# Patient Record
Sex: Female | Born: 1995 | Race: Black or African American | Hispanic: No | Marital: Single | Smoking: Never smoker
Health system: Southern US, Community
[De-identification: ages and names within clinical notes are randomized; demographics above are authoritative.]

## PROBLEM LIST (undated history)

## (undated) DIAGNOSIS — R51 Headache: Secondary | ICD-10-CM

## (undated) DIAGNOSIS — K219 Gastro-esophageal reflux disease without esophagitis: Secondary | ICD-10-CM

## (undated) DIAGNOSIS — R519 Headache, unspecified: Secondary | ICD-10-CM

## (undated) DIAGNOSIS — K2970 Gastritis, unspecified, without bleeding: Secondary | ICD-10-CM

## (undated) DIAGNOSIS — E559 Vitamin D deficiency, unspecified: Secondary | ICD-10-CM

## (undated) HISTORY — DX: Gastro-esophageal reflux disease without esophagitis: K21.9

## (undated) HISTORY — DX: Headache: R51

## (undated) HISTORY — DX: Headache, unspecified: R51.9

## (undated) HISTORY — DX: Gastritis, unspecified, without bleeding: K29.70

## (undated) HISTORY — PX: WISDOM TOOTH EXTRACTION: SHX21

## (undated) HISTORY — DX: Vitamin D deficiency, unspecified: E55.9

---
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
## 2015-12-07 DIAGNOSIS — H6523 Chronic serous otitis media, bilateral: Secondary | ICD-10-CM

## 2015-12-07 DIAGNOSIS — J309 Allergic rhinitis, unspecified: Secondary | ICD-10-CM | POA: Insufficient documentation

## 2015-12-08 ENCOUNTER — Ambulatory Visit (INDEPENDENT_AMBULATORY_CARE_PROVIDER_SITE_OTHER): Payer: Managed Care, Other (non HMO) | Admitting: Unknown Physician Specialty

## 2015-12-08 ENCOUNTER — Encounter: Payer: Self-pay | Admitting: Unknown Physician Specialty

## 2015-12-08 VITALS — BP 113/66 | HR 76 | Temp 97.5°F | Ht 63.3 in | Wt 138.6 lb

## 2015-12-08 DIAGNOSIS — R079 Chest pain, unspecified: Secondary | ICD-10-CM | POA: Diagnosis not present

## 2015-12-08 DIAGNOSIS — Z23 Encounter for immunization: Secondary | ICD-10-CM

## 2015-12-08 DIAGNOSIS — Z0000 Encounter for general adult medical examination without abnormal findings: Secondary | ICD-10-CM

## 2015-12-08 MED ORDER — OMEPRAZOLE 20 MG PO CPDR: 20.0000 mg | DELAYED_RELEASE_CAPSULE | Freq: Every day | ORAL | Status: DC

## 2015-12-08 NOTE — Progress Notes (Signed)
BP 113/66 mmHg  Pulse 76  Temp(Src) 97.5 F (36.4 C)  Ht 5' 3.3" (1.608 m)  Wt 138 lb 9.6 oz (62.869 kg)  BMI 24.31 kg/m2  SpO2 99%  LMP 11/26/2015 (Approximate)   Subjective:    Patient ID: Claudia Hanson, female    DOB: 06-20-1996, 19 y.o.   MRN: [id number]  HPI: Claudia Hanson is a 19 y.o. female  Chief Complaint  Patient presents with  . Chest Pain    pt states the pain comes and goes. States it is mainly on the right side but sometimes in the middle. [facility]   I reviewed the ER notes from 10/28/15 in which she is complaining of chest pain with dizzyness and lightheadedness.  The dizzy feeling are improved.  No obvious SOB but sometimes finds it hard to breath.  Chest pain starts when she lays down and sometimes when she gets up.  2 days ago her throat and chest hurt after eating. In the ER, had a D dimer, chest x-ray and EKG which were all normal.  No nausea or vomiting  Relevant past medical, surgical, family and social history reviewed and updated as indicated. Interim medical history since our last visit reviewed. Allergies and medications reviewed and updated.  Review of Systems  All other systems reviewed and are negative.   Per HPI unless specifically indicated above     Objective:    BP 113/66 mmHg  Pulse 76  Temp(Src) 97.5 F (36.4 C)  Ht 5' 3.3" (1.608 m)  Wt 138 lb 9.6 oz (62.869 kg)  BMI 24.31 kg/m2  SpO2 99%  LMP 11/26/2015 (Approximate)  Wt Readings from Last 3 Encounters:  12/08/15 138 lb 9.6 oz (62.869 kg)  05/12/15 136 lb (61.689 kg)    Physical Exam  Constitutional: She is oriented to person, place, and time. She appears well-developed and well-nourished. No distress.  HENT:  Head: Normocephalic and atraumatic.  Eyes: Conjunctivae and lids are normal. Right eye exhibits no discharge. Left eye exhibits no discharge. No scleral icterus.  Cardiovascular: Normal rate, regular rhythm and normal heart  sounds.  Exam reveals no gallop and no friction rub.   No murmur heard. Pulmonary/Chest: Effort normal and breath sounds normal. No respiratory distress.  Abdominal: Soft. Normal appearance and bowel sounds are normal. She exhibits no distension. There is no splenomegaly or hepatomegaly. There is no tenderness.  Musculoskeletal: Normal range of motion.  Neurological: She is alert and oriented to person, place, and time.  Skin: Skin is intact. No rash noted. No pallor.  Psychiatric: She has a normal mood and affect. Her behavior is normal. Judgment and thought content normal.    No results found for this or any previous visit.    Assessment & Plan:   Problem List Items Addressed This Visit    None    Visit Diagnoses    Immunization due    -  Primary    Relevant Orders    Flu Vaccine QUAD 36+ mos IM (Completed)    Chest pain, unspecified chest pain type        Pt with non-specific chest pain.  Chest x-ray and EKG were negative in the ER.  I will rx with Omeprazole 20 mg and order a cardiac echo for further evaluation    Relevant Orders    ECHO LIMITED        Follow up plan: Return in about 3 weeks (around 12/29/2015).

## 2015-12-08 NOTE — Addendum Note (Signed)
Addended by: Gabriel CirriWICKER, Jionni Helming on: 12/08/2015 10:48 AM   Modules accepted: Orders

## 2015-12-10 ENCOUNTER — Telehealth: Payer: Self-pay | Admitting: Unknown Physician Specialty

## 2015-12-10 DIAGNOSIS — R079 Chest pain, unspecified: Secondary | ICD-10-CM

## 2015-12-10 NOTE — Telephone Encounter (Signed)
An order was put in for a echo limited and [facility] Would like to clarify because they generally do a echo complete

## 2015-12-10 NOTE — Telephone Encounter (Signed)
[facility] and let [facility] know. Found out that the limited is generally done if a previous echo was done within the month. They would greatly appreciate it if you could put in a new order.

## 2015-12-10 NOTE — Telephone Encounter (Signed)
Echo complete is fine.  I just haven't ordered one and was not clear what simple vs complete entails

## 2015-12-11 LAB — QUANTIFERON IN TUBE
QFT TB AG MINUS NIL VALUE: <0 IU/mL
QUANTIFERON MITOGEN VALUE: 8.81 IU/mL
QUANTIFERON NIL VALUE: 0.02 [IU]/mL
QUANTIFERON TB AG VALUE: 0 [IU]/mL
QUANTIFERON TB GOLD: NEGATIVE

## 2015-12-11 LAB — QUANTIFERON TB GOLD ASSAY (BLOOD)

## 2015-12-22 ENCOUNTER — Ambulatory Visit
Admission: RE | Admit: 2015-12-22 | Discharge: 2015-12-22 | Disposition: A | Discharge status: Home / Self Care | Payer: Managed Care, Other (non HMO) | Source: Ambulatory Visit | Attending: Unknown Physician Specialty | Admitting: Unknown Physician Specialty

## 2015-12-22 DIAGNOSIS — R079 Chest pain, unspecified: Secondary | ICD-10-CM | POA: Diagnosis not present

## 2015-12-22 NOTE — Progress Notes (Signed)
*  PRELIMINARY RESULTS* Echocardiogram 2D Echocardiogram has been performed.  Claudia HousekeeperJerry R Hege 12/22/2015, 10:38 AM

## 2015-12-29 ENCOUNTER — Ambulatory Visit (INDEPENDENT_AMBULATORY_CARE_PROVIDER_SITE_OTHER): Payer: Managed Care, Other (non HMO) | Admitting: Unknown Physician Specialty

## 2015-12-29 ENCOUNTER — Encounter: Payer: Self-pay | Admitting: Unknown Physician Specialty

## 2015-12-29 VITALS — BP 135/76 | HR 73 | Temp 98.3°F | Ht 62.2 in | Wt 138.6 lb

## 2015-12-29 DIAGNOSIS — J4530 Mild persistent asthma, uncomplicated: Secondary | ICD-10-CM | POA: Diagnosis not present

## 2015-12-29 DIAGNOSIS — R079 Chest pain, unspecified: Secondary | ICD-10-CM | POA: Diagnosis not present

## 2015-12-29 DIAGNOSIS — J45909 Unspecified asthma, uncomplicated: Secondary | ICD-10-CM | POA: Insufficient documentation

## 2015-12-29 NOTE — Assessment & Plan Note (Signed)
Start Singulair and Albuterol prn for chest pain

## 2015-12-29 NOTE — Progress Notes (Signed)
BP 135/76 mmHg  Pulse 73  Temp(Src) 98.3 F (36.8 C)  Ht 5' 2.2" (1.58 m)  Wt 138 lb 9.6 oz (62.869 kg)  BMI 25.18 kg/m2  SpO2 99%  LMP 11/26/2015 (Approximate)   Subjective:    Patient ID: Claudia Hanson, female    DOB: 11-21-1996, 19 y.o.   MRN: [id number]  HPI: Claudia Hanson is a 20 y.o. female  Chief Complaint  Patient presents with  . Chest Pain    pt states she is following up on her chest pain that she states is better than it was when she was here last time   Reviewed Echocardiogram which was negative.  She had a chest x-ray in the ER when she was seen for the same which was negative.  Pt is better but this seems to be a problem that comes and goes.  Chest pain gets worse in the cold.  No wheezing or SOB.  No history of asthma.  Pain changes often with position changes.    Relevant past medical, surgical, family and social history reviewed and updated as indicated. Interim medical history since our last visit reviewed. Allergies and medications reviewed and updated.  Review of Systems  Per HPI unless specifically indicated above     Objective:    BP 135/76 mmHg  Pulse 73  Temp(Src) 98.3 F (36.8 C)  Ht 5' 2.2" (1.58 m)  Wt 138 lb 9.6 oz (62.869 kg)  BMI 25.18 kg/m2  SpO2 99%  LMP 11/26/2015 (Approximate)  Wt Readings from Last 3 Encounters:  12/29/15 138 lb 9.6 oz (62.869 kg)  12/08/15 138 lb 9.6 oz (62.869 kg)  05/12/15 136 lb (61.689 kg)    Physical Exam  Constitutional: She is oriented to person, place, and time. She appears well-developed and well-nourished. No distress.  HENT:  Head: Normocephalic and atraumatic.  Eyes: Conjunctivae and lids are normal. Right eye exhibits no discharge. Left eye exhibits no discharge. No scleral icterus.  Cardiovascular: Normal rate.   Pulmonary/Chest: Effort normal.  Abdominal: Normal appearance. There is no splenomegaly or hepatomegaly.  Musculoskeletal: Normal range of motion.  Neurological: She is alert and  oriented to person, place, and time.  Skin: Skin is intact. No rash noted. No pallor.  Psychiatric: She has a normal mood and affect. Her behavior is normal. Judgment and thought content normal.   Spirometry does show improvement pre and post.   Results for orders placed or performed in visit on 12/08/15  Quantiferon tb gold assay  Result Value Ref Range   QUANTIFERON INCUBATION Comment   QuantiFERON In Tube  Result Value Ref Range   QUANTIFERON TB GOLD Negative Negative   QUANTIFERON CRITERIA Comment    QUANTIFERON TB AG VALUE 0.00 IU/mL   Quantiferon Nil Value 0.02 IU/mL   QUANTIFERON MITOGEN VALUE 8.81 IU/mL   QFT TB AG MINUS NIL VALUE <0.00 IU/mL   Interpretation: Comment       Assessment & Plan:   Problem List Items Addressed This Visit      Unprioritized   Chest pain syndrome - Primary    Chest pain that is not reproducible.  Seems to be intermittent and it comes and goes and maybe associated with asthma.  Will treat asthma and see if chest pain improves      Relevant Orders   Spirometry with Graph (Completed)   Asthma    Start Singulair and Albuterol prn for chest pain          Follow up  plan: Return in about 6 weeks (around 02/09/2016).

## 2015-12-29 NOTE — Assessment & Plan Note (Addendum)
Chest pain that is not reproducible.  Seems to be intermittent and it comes and goes and maybe associated with asthma.  Will treat asthma and see if chest pain improves

## 2015-12-31 ENCOUNTER — Telehealth: Payer: Self-pay | Admitting: Unknown Physician Specialty

## 2015-12-31 NOTE — Telephone Encounter (Signed)
Pt called stated the pharmacy never received her RX from her last visit. Pt asks if the RX's can be sent again or called in. Pharm is [facility] on [address] Thanks.

## 2015-12-31 NOTE — Telephone Encounter (Signed)
Routing to provider. I called the patient to see which medication she was talking about and she stated that Elnita MaxwellCheryl was going to send in a pump and some kind of prescription for asthma.

## 2016-01-01 MED ORDER — ALBUTEROL SULFATE HFA 108 (90 BASE) MCG/ACT IN AERS: 2.0000 | INHALATION_SPRAY | Freq: Four times a day (QID) | RESPIRATORY_TRACT | Status: DC | PRN

## 2016-01-01 MED ORDER — MONTELUKAST SODIUM 10 MG PO TABS: 10.0000 mg | ORAL_TABLET | Freq: Every day | ORAL | Status: DC

## 2016-01-01 NOTE — Telephone Encounter (Signed)
Called and left patient a voicemail letting her know that her prescriptions were sent to her pharmacy.

## 2016-01-01 NOTE — Telephone Encounter (Signed)
Sent   Please let pt know

## 2016-02-12 ENCOUNTER — Ambulatory Visit: Payer: Managed Care, Other (non HMO) | Admitting: Unknown Physician Specialty

## 2016-02-12 ENCOUNTER — Ambulatory Visit (INDEPENDENT_AMBULATORY_CARE_PROVIDER_SITE_OTHER): Payer: Managed Care, Other (non HMO) | Admitting: Unknown Physician Specialty

## 2016-02-12 ENCOUNTER — Encounter: Payer: Self-pay | Admitting: Unknown Physician Specialty

## 2016-02-12 VITALS — BP 138/70 | HR 103 | Temp 98.8°F | Ht 61.8 in | Wt 136.6 lb

## 2016-02-12 DIAGNOSIS — H9201 Otalgia, right ear: Secondary | ICD-10-CM | POA: Diagnosis not present

## 2016-02-12 DIAGNOSIS — R0602 Shortness of breath: Secondary | ICD-10-CM | POA: Diagnosis not present

## 2016-02-12 DIAGNOSIS — R079 Chest pain, unspecified: Secondary | ICD-10-CM | POA: Diagnosis not present

## 2016-02-12 DIAGNOSIS — J45909 Unspecified asthma, uncomplicated: Secondary | ICD-10-CM | POA: Diagnosis not present

## 2016-02-12 NOTE — Progress Notes (Signed)
BP 138/70 mmHg  Pulse 103  Temp(Src) 98.8 F (37.1 C)  Ht 5' 1.8" (1.57 m)  Wt 136 lb 9.6 oz (61.961 kg)  BMI 25.14 kg/m2  SpO2 99%  LMP 11/26/2015 (Exact Date)   Subjective:    Patient ID: Claudia Hanson, female    DOB: [birth year], 19 y.o.   MRN: [id number]  HPI: Claudia Hanson is a 20 y.o. female  Chief Complaint  Patient presents with  . Asthma  . Headache    pt states she was seen at urgent care and diagnosed with sinusitis. states she also has a lump under chin that she was told to have looked at if it did not go away.   . Chest Pain    pt states she has been having right lower rib pain after having a fall   Chest pain Here to f/u from chest pain.  Pt states that she was doing well until recently at work when she had a sudden onset of chest pain with right arm pain.  Episode lasted about 15 minutes but was worried she was going to have a heart attack.  Following she felt light-headed and dizzy.    Asthma No improvement with Singulair and Albuterol.  She states it is "just not how it used to be."  Last Spirometry was normal  Depression screen [facility] 2/9 02/12/2016  Decreased Interest 0  Down, Depressed, Hopeless 1  PHQ - 2 Score 1  Altered sleeping 0  Tired, decreased energy 0  Change in appetite 2  Feeling bad or failure about yourself  1  Trouble concentrating 2  Moving slowly or fidgety/restless 0  Suicidal thoughts 0  PHQ-9 Score 6     Relevant past medical, surgical, family and social history reviewed and updated as indicated. Interim medical history since our last visit reviewed. Allergies and medications reviewed and updated.  Review of Systems  Constitutional: Negative.   HENT: Positive for ear pain.   Musculoskeletal:       Chest wall pain  Psychiatric/Behavioral: Negative.     Per HPI unless specifically indicated above     Objective:    BP 138/70 mmHg  Pulse 103  Temp(Src) 98.8 F (37.1 C)  Ht 5' 1.8" (1.57 m)  Wt 136 lb 9.6 oz (61.961 kg)   BMI 25.14 kg/m2  SpO2 99%  LMP 11/26/2015 (Exact Date)  Wt Readings from Last 3 Encounters:  02/12/16 136 lb 9.6 oz (61.961 kg)  12/29/15 138 lb 9.6 oz (62.869 kg)  12/08/15 138 lb 9.6 oz (62.869 kg)    Physical Exam  Constitutional: She is oriented to person, place, and time. She appears well-developed and well-nourished. No distress.  HENT:  Head: Normocephalic and atraumatic.  Eyes: Conjunctivae and lids are normal. Right eye exhibits no discharge. Left eye exhibits no discharge. No scleral icterus.  Neck: Normal range of motion. Neck supple. No JVD present. Carotid bruit is not present. No tracheal deviation present. No thyromegaly present.  No lymphadenopathy noted  Cardiovascular: Normal rate, regular rhythm and normal heart sounds.   Pulmonary/Chest: Effort normal and breath sounds normal.  Abdominal: Normal appearance. There is no splenomegaly or hepatomegaly.  Musculoskeletal: Normal range of motion.  Neurological: She is alert and oriented to person, place, and time.  Skin: Skin is warm, dry and intact. No rash noted. No pallor.  Psychiatric: She has a normal mood and affect. Her behavior is normal. Judgment and thought content normal.       Assessment &  Plan:   Problem List Items Addressed This Visit      Unprioritized   Chest pain syndrome    Suspect pain and SOB related to anxiety.  Discussed counseling through her school at [employer]      RESOLVED: Asthma - Primary    Stop singulair for no effict       Other Visit Diagnoses    Ear pain, right        Normal.  suspect related to TMJ    SOB (shortness of breath)        Suspect related to anxiety         Follow up plan: Return in about 3 months (around 05/11/2016).

## 2016-02-12 NOTE — Assessment & Plan Note (Signed)
Stop singulair for no effict

## 2016-02-12 NOTE — Assessment & Plan Note (Signed)
Suspect pain and SOB related to anxiety.  Discussed counseling through her school at [employer] [facility]

## 2016-02-19 ENCOUNTER — Ambulatory Visit (INDEPENDENT_AMBULATORY_CARE_PROVIDER_SITE_OTHER): Payer: Managed Care, Other (non HMO) | Admitting: Family Medicine

## 2016-02-19 ENCOUNTER — Encounter: Payer: Self-pay | Admitting: Family Medicine

## 2016-02-19 VITALS — BP 125/84 | HR 92 | Temp 99.3°F | Ht 62.7 in | Wt 137.0 lb

## 2016-02-19 DIAGNOSIS — R599 Enlarged lymph nodes, unspecified: Secondary | ICD-10-CM | POA: Diagnosis not present

## 2016-02-19 DIAGNOSIS — R030 Elevated blood-pressure reading, without diagnosis of hypertension: Secondary | ICD-10-CM

## 2016-02-19 DIAGNOSIS — IMO0001 Reserved for inherently not codable concepts without codable children: Secondary | ICD-10-CM

## 2016-02-19 DIAGNOSIS — R591 Generalized enlarged lymph nodes: Secondary | ICD-10-CM

## 2016-02-19 NOTE — Patient Instructions (Signed)
DASH Eating Plan  DASH stands for "Dietary Approaches to Stop Hypertension." The DASH eating plan is a healthy eating plan that has been shown to reduce high blood pressure (hypertension). Additional health benefits may include reducing the risk of type 2 diabetes mellitus, heart disease, and stroke. The DASH eating plan may also help with weight loss.  WHAT DO I NEED TO KNOW ABOUT THE DASH EATING PLAN?  For the DASH eating plan, you will follow these general guidelines:  · Choose foods with a percent daily value for sodium of less than 5% (as listed on the food label).  · Use salt-free seasonings or herbs instead of table salt or sea salt.  · Check with your health care provider or pharmacist before using salt substitutes.  · Eat lower-sodium products, often labeled as "lower sodium" or "no salt added."  · Eat fresh foods.  · Eat more vegetables, fruits, and low-fat dairy products.  · Choose whole grains. Look for the word "whole" as the first word in the ingredient list.  · Choose fish and skinless chicken or turkey more often than red meat. Limit fish, poultry, and meat to 6 oz (170 g) each day.  · Limit sweets, desserts, sugars, and sugary drinks.  · Choose heart-healthy fats.  · Limit cheese to 1 oz (28 g) per day.  · Eat more home-cooked food and less restaurant, buffet, and fast food.  · Limit fried foods.  · Cook foods using methods other than frying.  · Limit canned vegetables. If you do use them, rinse them well to decrease the sodium.  · When eating at a restaurant, ask that your food be prepared with less salt, or no salt if possible.  WHAT FOODS CAN I EAT?  Seek help from a dietitian for individual calorie needs.  Grains  Whole grain or whole wheat bread. Brown rice. Whole grain or whole wheat pasta. Quinoa, bulgur, and whole grain cereals. Low-sodium cereals. Corn or whole wheat flour tortillas. Whole grain cornbread. Whole grain crackers. Low-sodium crackers.  Vegetables  Fresh or frozen vegetables  (raw, steamed, roasted, or grilled). Low-sodium or reduced-sodium tomato and vegetable juices. Low-sodium or reduced-sodium tomato sauce and paste. Low-sodium or reduced-sodium canned vegetables.   Fruits  All fresh, canned (in natural juice), or frozen fruits.  Meat and Other Protein Products  Ground beef (85% or leaner), grass-fed beef, or beef trimmed of fat. Skinless chicken or turkey. Ground chicken or turkey. Pork trimmed of fat. All fish and seafood. Eggs. Dried beans, peas, or lentils. Unsalted nuts and seeds. Unsalted canned beans.  Dairy  Low-fat dairy products, such as skim or 1% milk, 2% or reduced-fat cheeses, low-fat ricotta or cottage cheese, or plain low-fat yogurt. Low-sodium or reduced-sodium cheeses.  Fats and Oils  Tub margarines without trans fats. Light or reduced-fat mayonnaise and salad dressings (reduced sodium). Avocado. Safflower, olive, or canola oils. Natural peanut or almond butter.  Other  Unsalted popcorn and pretzels.  The items listed above may not be a complete list of recommended foods or beverages. Contact your dietitian for more options.  WHAT FOODS ARE NOT RECOMMENDED?  Grains  White bread. White pasta. White rice. Refined cornbread. Bagels and croissants. Crackers that contain trans fat.  Vegetables  Creamed or fried vegetables. Vegetables in a cheese sauce. Regular canned vegetables. Regular canned tomato sauce and paste. Regular tomato and vegetable juices.  Fruits  Dried fruits. Canned fruit in light or heavy syrup. Fruit juice.  Meat and Other Protein   Products  Fatty cuts of meat. Ribs, chicken wings, bacon, sausage, bologna, salami, chitterlings, fatback, hot dogs, bratwurst, and packaged luncheon meats. Salted nuts and seeds. Canned beans with salt.  Dairy  Whole or 2% milk, cream, half-and-half, and cream cheese. Whole-fat or sweetened yogurt. Full-fat cheeses or blue cheese. Nondairy creamers and whipped toppings. Processed cheese, cheese spreads, or cheese  curds.  Condiments  Onion and garlic salt, seasoned salt, table salt, and sea salt. Canned and packaged gravies. Worcestershire sauce. Tartar sauce. Barbecue sauce. Teriyaki sauce. Soy sauce, including reduced sodium. Steak sauce. Fish sauce. Oyster sauce. Cocktail sauce. Horseradish. Ketchup and mustard. Meat flavorings and tenderizers. Bouillon cubes. Hot sauce. Tabasco sauce. Marinades. Taco seasonings. Relishes.  Fats and Oils  Butter, stick margarine, lard, shortening, ghee, and bacon fat. Coconut, palm kernel, or palm oils. Regular salad dressings.  Other  Pickles and olives. Salted popcorn and pretzels.  The items listed above may not be a complete list of foods and beverages to avoid. Contact your dietitian for more information.  WHERE CAN I FIND MORE INFORMATION?  National Heart, Lung, and Blood Institute: www.nhlbi.nih.gov/health/health-topics/topics/dash/     This information is not intended to replace advice given to you by your health care provider. Make sure you discuss any questions you have with your health care provider.     Document Released: 12/01/2011 Document Revised: 01/02/2015 Document Reviewed: 10/16/2013  Elsevier Interactive Patient Education ©2016 Elsevier Inc.

## 2016-02-19 NOTE — Progress Notes (Signed)
BP 125/84 mmHg  Pulse 92  Temp(Src) 99.3 F (37.4 C)  Ht 5' 2.7" (1.593 m)  Wt 137 lb (62.143 kg)  BMI 24.49 kg/m2  SpO2 98%  LMP 12/05/2015   Subjective:    Patient ID: Claudia Hanson, female    DOB: 1996/06/21, 20 y.o.   MRN: [id number]  HPI: Claudia Hanson is a 20 y.o. female  Chief Complaint  Patient presents with  . Hypertension  . "bump" behind left ear   ER FOLLOW UP Time since discharge: 4 days ago Hospital/facility: [facility] Diagnosis: UTI Procedures/tests: UA, chest x-ray, EKG  New medications: abx Discharge instructions: follow up here   Status: better  ELEVATED BLOOD PRESSURE- went to urgent care and her BP was elevated up to 150s, had UTI with very high fever Duration of elevated BP: 1 week BP monitoring frequency: not checking Previous BP meds: none Recent stressors: yes Family history of hypertension: no Recurrent headaches: yes, daily, have resolved Visual changes: no Palpitations: yes  Dyspnea: no Chest pain: yes- chronic has been going on for a while Lower extremity edema: no Dizzy/lightheaded: yes Transient ischemic attacks:no  Has a bump behind her L ear.   Relevant past medical, surgical, family and social history reviewed and updated as indicated. Interim medical history since our last visit reviewed. Allergies and medications reviewed and updated.  Review of Systems  Constitutional: Negative.   Respiratory: Negative.   Cardiovascular: Negative.   Psychiatric/Behavioral: Negative.     Per HPI unless specifically indicated above     Objective:    BP 125/84 mmHg  Pulse 92  Temp(Src) 99.3 F (37.4 C)  Ht 5' 2.7" (1.593 m)  Wt 137 lb (62.143 kg)  BMI 24.49 kg/m2  SpO2 98%  LMP 12/05/2015  Wt Readings from Last 3 Encounters:  02/19/16 137 lb (62.143 kg)  02/12/16 136 lb 9.6 oz (61.961 kg)  12/29/15 138 lb 9.6 oz (62.869 kg)    Physical Exam  Constitutional: She is oriented to person, place, and time. She appears well-developed  and well-nourished. No distress.  HENT:  Head: Normocephalic and atraumatic.  Right Ear: Hearing normal.  Left Ear: Hearing normal.  Nose: Nose normal.  Posterior auricular lymph node slightly enlarged on the L  Eyes: Conjunctivae and lids are normal. Right eye exhibits no discharge. Left eye exhibits no discharge. No scleral icterus.  Cardiovascular: Normal rate, regular rhythm, normal heart sounds and intact distal pulses.  Exam reveals no gallop and no friction rub.   No murmur heard. Pulmonary/Chest: Effort normal and breath sounds normal. No respiratory distress. She has no wheezes. She has no rales. She exhibits no tenderness.  Musculoskeletal: Normal range of motion.  Neurological: She is alert and oriented to person, place, and time.  Skin: Skin is warm, dry and intact. No rash noted. No erythema. No pallor.  Psychiatric: She has a normal mood and affect. Her speech is normal and behavior is normal. Judgment and thought content normal. Cognition and memory are normal.  Nursing note and vitals reviewed.   Results for orders placed or performed in visit on 12/08/15  Quantiferon tb gold assay  Result Value Ref Range   QUANTIFERON INCUBATION Comment   QuantiFERON In Tube  Result Value Ref Range   QUANTIFERON TB GOLD Negative Negative   QUANTIFERON CRITERIA Comment    QUANTIFERON TB AG VALUE 0.00 IU/mL   Quantiferon Nil Value 0.02 IU/mL   QUANTIFERON MITOGEN VALUE 8.81 IU/mL   QFT TB AG MINUS NIL VALUE <0.00  IU/mL   Interpretation: Comment       Assessment & Plan:   Problem List Items Addressed This Visit    None    Visit Diagnoses    Elevated BP    -  Primary    BP good today. Likely due to her being sick. of exercise/week, DASH diet given. Recheck at follow up.     Lymphadenopathy of head and neck        Stop rubbing it. Monitor. If not better by next visit, check CBC.         Follow up plan: Return As scheduled.

## 2016-03-21 ENCOUNTER — Telehealth: Payer: Self-pay

## 2016-03-21 NOTE — Telephone Encounter (Signed)
Patient called and left me a voicemail saying that she was returning a call from us. I called and spoke to patient. She stated that she was on my-chart last night and requested an appointment. She stated that someone called her this morning but they did not leave a message. Patient stated she wanted to schedule an appointment for leg pain so I scheduled patient an appointment for 03/25/16 at 11am.

## 2016-03-25 ENCOUNTER — Encounter: Payer: Self-pay | Admitting: Unknown Physician Specialty

## 2016-03-25 ENCOUNTER — Ambulatory Visit (INDEPENDENT_AMBULATORY_CARE_PROVIDER_SITE_OTHER): Payer: Managed Care, Other (non HMO) | Admitting: Unknown Physician Specialty

## 2016-03-25 VITALS — BP 149/90 | HR 106 | Temp 98.7°F | Ht 62.0 in | Wt 137.2 lb

## 2016-03-25 DIAGNOSIS — M791 Myalgia, unspecified site: Secondary | ICD-10-CM

## 2016-03-25 NOTE — Progress Notes (Signed)
BP 149/90 mmHg  Pulse 106  Temp(Src) 98.7 F (37.1 C)  Ht '5\' 2"'  (1.575 m)  Wt 137 lb 3.2 oz (62.234 kg)  BMI 25.09 kg/m2  SpO2 98%  LMP 03/12/2016 (Approximate)   Subjective:    Patient ID: Claudia Hanson, female    DOB: [birth year], 20 y.o.   MRN: [id number]  HPI: Claudia Hanson is a 20 y.o. female  Chief Complaint  Patient presents with  . Leg Pain    pt states she has been having pain in both legs for about 2 weeks, states the pain is mainly behind her knees and back of thighs   Pt states pain has been going on for about 2 weeks.  She states her arms hurt, mostly in muscles but possibly in elbows.  Note is made about frequent visits for non-specific chest pain.  No fatigue but sometimes dizzy or light-headed and gets ringing in ears.  Numbness in feet that comes and goes  Relevant past medical, surgical, family and social history reviewed and updated as indicated. Interim medical history since our last visit reviewed. Allergies and medications reviewed and updated.  Review of Systems  Per HPI unless specifically indicated above     Objective:    BP 149/90 mmHg  Pulse 106  Temp(Src) 98.7 F (37.1 C)  Ht '5\' 2"'  (1.575 m)  Wt 137 lb 3.2 oz (62.234 kg)  BMI 25.09 kg/m2  SpO2 98%  LMP 03/12/2016 (Approximate)  Wt Readings from Last 3 Encounters:  03/25/16 137 lb 3.2 oz (62.234 kg)  02/19/16 137 lb (62.143 kg)  02/12/16 136 lb 9.6 oz (61.961 kg)    Physical Exam  Constitutional: She is oriented to person, place, and time. She appears well-developed and well-nourished. No distress.  HENT:  Head: Normocephalic and atraumatic.  Eyes: Conjunctivae and lids are normal. Right eye exhibits no discharge. Left eye exhibits no discharge. No scleral icterus.  Neck: Normal range of motion. Neck supple. No JVD present. Carotid bruit is not present.  Cardiovascular: Normal rate, regular rhythm and normal heart sounds.   Pulmonary/Chest: Effort normal and breath sounds normal.   Abdominal: Normal appearance. There is no splenomegaly or hepatomegaly.  Musculoskeletal: Normal range of motion. She exhibits no edema or tenderness.  Neurological: She is alert and oriented to person, place, and time. She has normal reflexes.  Skin: Skin is warm, dry and intact. No rash noted. No pallor.  Psychiatric: She has a normal mood and affect. Her behavior is normal. Judgment and thought content normal.    Results for orders placed or performed in visit on 12/08/15  Quantiferon tb gold assay  Result Value Ref Range   QUANTIFERON INCUBATION Comment   QuantiFERON In Tube  Result Value Ref Range   QUANTIFERON TB GOLD Negative Negative   QUANTIFERON CRITERIA Comment    QUANTIFERON TB AG VALUE 0.00 IU/mL   Quantiferon Nil Value 0.02 IU/mL   QUANTIFERON MITOGEN VALUE 8.81 IU/mL   QFT TB AG MINUS NIL VALUE <0.00 IU/mL   Interpretation: Comment       Assessment & Plan:   Problem List Items Addressed This Visit    None    Visit Diagnoses    Muscle pain    -  Primary    Pt with persistant pain in joints and muscles at multiple sites.  Will check labs.   Refer to rheumatology for further evaluation    Relevant Orders    ANA w/Reflex    Ferritin  CBC with Differential/Platelet    Sed Rate (ESR)    C-reactive protein    Rheumatoid Arthritis Profile    Ambulatory referral to Rheumatology    Comprehensive metabolic panel    VITAMIN D 25 Hydroxy (Vit-D Deficiency, Fractures)    Vitamin B12         Follow up plan: Return for resutls and f/u with rheumatologist for negative labs or positive rheumatology labs.

## 2016-03-27 LAB — CBC WITH DIFFERENTIAL/PLATELET
Basophils Absolute: 0 10*3/uL (ref 0.0–0.2)
Basos: 0 %
EOS (ABSOLUTE): 0.3 10*3/uL (ref 0.0–0.4)
EOS: 2 %
HEMATOCRIT: 39.4 % (ref 34.0–46.6)
Hemoglobin: 13 g/dL (ref 11.1–15.9)
Immature Grans (Abs): 0 10*3/uL (ref 0.0–0.1)
Immature Granulocytes: 0 %
LYMPHS ABS: 1.8 10*3/uL (ref 0.7–3.1)
Lymphs: 16 %
MCH: 29.8 pg (ref 26.6–33.0)
MCHC: 33 g/dL (ref 31.5–35.7)
MCV: 90 fL (ref 79–97)
MONOS ABS: 0.5 10*3/uL (ref 0.1–0.9)
Monocytes: 5 %
Neutrophils Absolute: 8.9 10*3/uL — ABNORMAL HIGH (ref 1.4–7.0)
Neutrophils: 77 %
Platelets: 292 10*3/uL (ref 150–379)
RBC: 4.36 x10E6/uL (ref 3.77–5.28)
RDW: 12.6 % (ref 12.3–15.4)
WBC: 11.5 10*3/uL — AB (ref 3.4–10.8)

## 2016-03-27 LAB — COMPREHENSIVE METABOLIC PANEL
ALBUMIN: 4.5 g/dL (ref 3.5–5.5)
ALK PHOS: 47 IU/L (ref 39–117)
ALT: 12 IU/L (ref 0–32)
AST: 16 IU/L (ref 0–40)
Albumin/Globulin Ratio: 1.7 (ref 1.2–2.2)
BILIRUBIN TOTAL: 0.2 mg/dL (ref 0.0–1.2)
BUN / CREAT RATIO: 9 (ref 8–20)
BUN: 6 mg/dL (ref 6–20)
CHLORIDE: 107 mmol/L — AB (ref 96–106)
CO2: 22 mmol/L (ref 18–29)
CREATININE: 0.67 mg/dL (ref 0.57–1.00)
Calcium: 9.6 mg/dL (ref 8.7–10.2)
GFR calc Af Amer: 145 mL/min/{1.73_m2} (ref >59)
GFR calc non Af Amer: 126 mL/min/{1.73_m2} (ref >59)
GLOBULIN, TOTAL: 2.7 g/dL (ref 1.5–4.5)
GLUCOSE: 67 mg/dL (ref 65–99)
Potassium: 4.9 mmol/L (ref 3.5–5.2)
SODIUM: 147 mmol/L — AB (ref 134–144)
Total Protein: 7.2 g/dL (ref 6.0–8.5)

## 2016-03-27 LAB — VITAMIN D 25 HYDROXY (VIT D DEFICIENCY, FRACTURES): VIT D 25 HYDROXY: 10.5 ng/mL — AB (ref 30.0–100.0)

## 2016-03-27 LAB — ANA W/REFLEX: Anti Nuclear Antibody(ANA): NEGATIVE

## 2016-03-27 LAB — SEDIMENTATION RATE: Sed Rate: 4 mm/hr (ref 0–32)

## 2016-03-27 LAB — VITAMIN B12: VITAMIN B 12: 261 pg/mL (ref 211–946)

## 2016-03-27 LAB — RHEUMATOID ARTHRITIS PROFILE
Cyclic Citrullin Peptide Ab: 4 units (ref 0–19)
Rhuematoid fact SerPl-aCnc: <10 IU/mL (ref 0.0–13.9)

## 2016-03-27 LAB — C-REACTIVE PROTEIN: CRP: 50.2 mg/L — AB (ref 0.0–4.9)

## 2016-03-27 LAB — FERRITIN: FERRITIN: 50 ng/mL (ref 15–150)

## 2016-03-28 ENCOUNTER — Other Ambulatory Visit: Payer: Self-pay | Admitting: Unknown Physician Specialty

## 2016-03-28 ENCOUNTER — Telehealth: Payer: Self-pay

## 2016-03-28 DIAGNOSIS — R7982 Elevated C-reactive protein (CRP): Secondary | ICD-10-CM

## 2016-03-28 MED ORDER — VITAMIN D (ERGOCALCIFEROL) 1.25 MG (50000 UNIT) PO CAPS: 50000.0000 [IU] | ORAL_CAPSULE | ORAL | Status: DC

## 2016-03-28 NOTE — Telephone Encounter (Signed)
Patient called and left me a voicemail stating that she would like to speak to [place] about some tests she had done and is concerned about. She requests a call back from [place] at [phone].

## 2016-03-28 NOTE — Telephone Encounter (Signed)
Phone call with pt.  Discussed referral to rheumatology due to non-specific elevation in CRP and intermittent pain.

## 2016-04-01 DIAGNOSIS — M791 Myalgia, unspecified site: Secondary | ICD-10-CM | POA: Insufficient documentation

## 2016-04-01 DIAGNOSIS — E559 Vitamin D deficiency, unspecified: Secondary | ICD-10-CM | POA: Insufficient documentation

## 2016-04-01 DIAGNOSIS — R7982 Elevated C-reactive protein (CRP): Secondary | ICD-10-CM | POA: Insufficient documentation

## 2016-05-13 ENCOUNTER — Ambulatory Visit: Payer: Managed Care, Other (non HMO) | Admitting: Unknown Physician Specialty

## 2016-05-17 ENCOUNTER — Ambulatory Visit (INDEPENDENT_AMBULATORY_CARE_PROVIDER_SITE_OTHER): Payer: Managed Care, Other (non HMO) | Admitting: Unknown Physician Specialty

## 2016-05-17 ENCOUNTER — Encounter: Payer: Self-pay | Admitting: Unknown Physician Specialty

## 2016-05-17 VITALS — BP 136/84 | HR 78 | Temp 98.1°F | Ht 63.1 in | Wt 139.2 lb

## 2016-05-17 DIAGNOSIS — D72829 Elevated white blood cell count, unspecified: Secondary | ICD-10-CM | POA: Diagnosis not present

## 2016-05-17 DIAGNOSIS — R0781 Pleurodynia: Secondary | ICD-10-CM

## 2016-05-17 DIAGNOSIS — K5900 Constipation, unspecified: Secondary | ICD-10-CM | POA: Insufficient documentation

## 2016-05-17 DIAGNOSIS — R1031 Right lower quadrant pain: Secondary | ICD-10-CM | POA: Diagnosis not present

## 2016-05-17 NOTE — Patient Instructions (Signed)
OK to take Miralax for constipation

## 2016-05-17 NOTE — Progress Notes (Signed)
BP 136/84 mmHg  Pulse 78  Temp(Src) 98.1 F (36.7 C)  Ht 5' 3.1" (1.603 m)  Wt 139 lb 3.2 oz (63.141 kg)  BMI 24.57 kg/m2  SpO2 99%  LMP 03/17/2016 (Approximate)   Subjective:    Patient ID: Claudia Hanson, female    DOB: [birth year], 20 y.o.   MRN: [id number]  HPI: Claudia Hanson is a 20 y.o. female  Chief Complaint  Patient presents with  . Hypertension  . Asthma   Myalgias Went to rheumatology for this and elevated CRP.  Outside notes were reviewed.  Work up was negative at the rheumatologist. Noted Leukocytosis there and needs to be repeated.  They will check her back in 1 year.  CRP returned to normal with Rheumatology.  She is taking her Vitamin D.    Rib pain Pt states she is having right sided anterior rib pain.  States it hurts daily and worse with stretching and going to sleep.  Sometimes it hurts towards the back.  Last chest x-ray 02/16/16 not abnormal.  States it started after falling down steps but seems to be getting worse.    Constipation Finds she is constipated.  Took a laxative but "went back"  This has been going on for about 1 month.  States she has "black spots" and mucous in stools   Relevant past medical, surgical, family and social history reviewed and updated as indicated. Interim medical history since our last visit reviewed. Allergies and medications reviewed and updated.  Review of Systems  Per HPI unless specifically indicated above     Objective:    BP 136/84 mmHg  Pulse 78  Temp(Src) 98.1 F (36.7 C)  Ht 5' 3.1" (1.603 m)  Wt 139 lb 3.2 oz (63.141 kg)  BMI 24.57 kg/m2  SpO2 99%  LMP 03/17/2016 (Approximate)  Wt Readings from Last 3 Encounters:  05/17/16 139 lb 3.2 oz (63.141 kg)  03/25/16 137 lb 3.2 oz (62.234 kg)  02/19/16 137 lb (62.143 kg)    Physical Exam  Constitutional: She is oriented to person, place, and time. She appears well-developed and well-nourished. No distress.  HENT:  Head: Normocephalic and atraumatic.  Eyes:  Conjunctivae and lids are normal. Right eye exhibits no discharge. Left eye exhibits no discharge. No scleral icterus.  Neck: Normal range of motion. Neck supple. No JVD present. Carotid bruit is not present.  Cardiovascular: Normal rate, regular rhythm and normal heart sounds.   No chest wall pain with palpation  Pulmonary/Chest: Effort normal and breath sounds normal.  Abdominal: Soft. Normal appearance. There is no splenomegaly or hepatomegaly. There is no tenderness. There is no rigidity, no rebound, no guarding, no CVA tenderness, no tenderness at McBurney's point and negative Murphy's sign.  Musculoskeletal: Normal range of motion.  Neurological: She is alert and oriented to person, place, and time.  Skin: Skin is warm, dry and intact. No rash noted. No pallor.  Psychiatric: She has a normal mood and affect. Her behavior is normal. Judgment and thought content normal.    Results for orders placed or performed in visit on 03/25/16  ANA w/Reflex  Result Value Ref Range   Anit Nuclear Antibody(ANA) Negative Negative  Ferritin  Result Value Ref Range   Ferritin 50 15 - 150 ng/mL  CBC with Differential/Platelet  Result Value Ref Range   WBC 11.5 (H) 3.4 - 10.8 x10E3/uL   RBC 4.36 3.77 - 5.28 x10E6/uL   Hemoglobin 13.0 11.1 - 15.9 g/dL   Hematocrit 39.4 34.0 -  46.6 %   MCV 90 79 - 97 fL   MCH 29.8 26.6 - 33.0 pg   MCHC 33.0 31.5 - 35.7 g/dL   RDW 12.6 12.3 - 15.4 %   Platelets 292 150 - 379 x10E3/uL   Neutrophils 77 %   Lymphs 16 %   Monocytes 5 %   Eos 2 %   Basos 0 %   Neutrophils Absolute 8.9 (H) 1.4 - 7.0 x10E3/uL   Lymphocytes Absolute 1.8 0.7 - 3.1 x10E3/uL   Monocytes Absolute 0.5 0.1 - 0.9 x10E3/uL   EOS (ABSOLUTE) 0.3 0.0 - 0.4 x10E3/uL   Basophils Absolute 0.0 0.0 - 0.2 x10E3/uL   Immature Granulocytes 0 %   Immature Grans (Abs) 0.0 0.0 - 0.1 x10E3/uL  Sed Rate (ESR)  Result Value Ref Range   Sed Rate 4 0 - 32 mm/hr  C-reactive protein  Result Value Ref Range    CRP 50.2 (H) 0.0 - 4.9 mg/L  Rheumatoid Arthritis Profile  Result Value Ref Range   Rhuematoid fact SerPl-aCnc <10.0 0.0 - 29.5 IU/mL   Cyclic Citrullin Peptide Ab 4 0 - 19 units  Comprehensive metabolic panel  Result Value Ref Range   Glucose 67 65 - 99 mg/dL   BUN 6 6 - 20 mg/dL   Creatinine, Ser 0.67 0.57 - 1.00 mg/dL   GFR calc non Af Amer 126 >59 mL/min/1.73   GFR calc Af Amer 145 >59 mL/min/1.73   BUN/Creatinine Ratio 9 8 - 20   Sodium 147 (H) 134 - 144 mmol/L   Potassium 4.9 3.5 - 5.2 mmol/L   Chloride 107 (H) 96 - 106 mmol/L   CO2 22 18 - 29 mmol/L   Calcium 9.6 8.7 - 10.2 mg/dL   Total Protein 7.2 6.0 - 8.5 g/dL   Albumin 4.5 3.5 - 5.5 g/dL   Globulin, Total 2.7 1.5 - 4.5 g/dL   Albumin/Globulin Ratio 1.7 1.2 - 2.2   Bilirubin Total 0.2 0.0 - 1.2 mg/dL   Alkaline Phosphatase 47 39 - 117 IU/L   AST 16 0 - 40 IU/L   ALT 12 0 - 32 IU/L  VITAMIN D 25 Hydroxy (Vit-D Deficiency, Fractures)  Result Value Ref Range   Vit D, 25-Hydroxy 10.5 (L) 30.0 - 100.0 ng/mL  Vitamin B12  Result Value Ref Range   Vitamin B-12 261 211 - 946 pg/mL      Assessment & Plan:   Problem List Items Addressed This Visit      Unprioritized   Constipation    Discussed using Miralax      Relevant Orders   CBC with Differential/Platelet    Other Visit Diagnoses    Leukocytosis    -  Primary    Check CBC today    Relevant Orders    CBC with Differential/Platelet    Rib pain on right side        Difficult to tell if chest wall vs abdoman.  Order [place] and right rib films    Relevant Orders    DG Ribs Unilateral Right    Right lower quadrant abdominal pain        Relevant Orders    US Abdomen Complete        Follow up plan: Return in about 2 weeks (around 05/31/2016).

## 2016-05-17 NOTE — Assessment & Plan Note (Signed)
Discussed using Miralax

## 2016-05-18 LAB — CBC WITH DIFFERENTIAL/PLATELET
BASOS ABS: 0 10*3/uL (ref 0.0–0.2)
Basos: 0 %
EOS (ABSOLUTE): 0.2 10*3/uL (ref 0.0–0.4)
Eos: 2 %
Hematocrit: 37.7 % (ref 34.0–46.6)
Hemoglobin: 12.3 g/dL (ref 11.1–15.9)
IMMATURE GRANULOCYTES: 0 %
Immature Grans (Abs): 0 10*3/uL (ref 0.0–0.1)
Lymphocytes Absolute: 2.5 10*3/uL (ref 0.7–3.1)
Lymphs: 29 %
MCH: 29.8 pg (ref 26.6–33.0)
MCHC: 32.6 g/dL (ref 31.5–35.7)
MCV: 91 fL (ref 79–97)
MONOS ABS: 0.4 10*3/uL (ref 0.1–0.9)
Monocytes: 5 %
NEUTROS PCT: 64 %
Neutrophils Absolute: 5.4 10*3/uL (ref 1.4–7.0)
PLATELETS: 355 10*3/uL (ref 150–379)
RBC: 4.13 x10E6/uL (ref 3.77–5.28)
RDW: 13 % (ref 12.3–15.4)
WBC: 8.5 10*3/uL (ref 3.4–10.8)

## 2016-05-20 ENCOUNTER — Ambulatory Visit
Admission: RE | Admit: 2016-05-20 | Discharge: 2016-05-20 | Disposition: A | Discharge status: Home / Self Care | Payer: Managed Care, Other (non HMO) | Source: Ambulatory Visit | Attending: Unknown Physician Specialty | Admitting: Unknown Physician Specialty

## 2016-05-20 DIAGNOSIS — R1031 Right lower quadrant pain: Secondary | ICD-10-CM | POA: Insufficient documentation

## 2016-05-30 ENCOUNTER — Ambulatory Visit: Payer: Managed Care, Other (non HMO) | Admitting: Unknown Physician Specialty

## 2016-05-31 ENCOUNTER — Ambulatory Visit (INDEPENDENT_AMBULATORY_CARE_PROVIDER_SITE_OTHER): Payer: Managed Care, Other (non HMO) | Admitting: Unknown Physician Specialty

## 2016-05-31 ENCOUNTER — Encounter: Payer: Self-pay | Admitting: Unknown Physician Specialty

## 2016-05-31 VITALS — BP 128/84 | HR 74 | Temp 97.7°F | Ht 62.0 in | Wt 138.6 lb

## 2016-05-31 DIAGNOSIS — Z0000 Encounter for general adult medical examination without abnormal findings: Secondary | ICD-10-CM | POA: Diagnosis not present

## 2016-05-31 DIAGNOSIS — R0781 Pleurodynia: Secondary | ICD-10-CM | POA: Diagnosis not present

## 2016-05-31 DIAGNOSIS — Z23 Encounter for immunization: Secondary | ICD-10-CM | POA: Diagnosis not present

## 2016-05-31 DIAGNOSIS — K5900 Constipation, unspecified: Secondary | ICD-10-CM | POA: Diagnosis not present

## 2016-05-31 NOTE — Patient Instructions (Addendum)
HPV Vaccine Gardasil (Human Papillomavirus): What You Need to Know 1. What is HPV? Genital human papillomavirus (HPV) is the most common sexually transmitted virus in the [place]. More than half of sexually active men and women are infected with HPV at some time in their lives. About 20 million Americans are currently infected, and about 6 million more get infected each year. HPV is usually spread through sexual contact. Most HPV infections don't cause any symptoms, and go away on their own. But HPV can cause cervical cancer in women. Cervical cancer is the 2nd leading cause of cancer deaths among women around the world. In the [place], about 12,000 women get cervical cancer every year and about 4,000 are expected to die from it. HPV is also associated with several less common cancers, such as vaginal and vulvar cancers in women, and anal and oropharyngeal (back of the throat, including base of tongue and tonsils) cancers in both men and women. HPV can also cause genital warts and warts in the throat. There is no cure for HPV infection, but some of the problems it causes can be treated. 2. HPV vaccine: Why get vaccinated? The HPV vaccine you are getting is one of two vaccines that can be given to prevent HPV. It may be given to both males and females.  This vaccine can prevent most cases of cervical cancer in females, if it is given before exposure to the virus. In addition, it can prevent vaginal and vulvar cancer in females, and genital warts and anal cancer in both males and females. Protection from HPV vaccine is expected to be long-lasting. But vaccination is not a substitute for cervical cancer screening. Women should still get regular Pap tests. 3. Who should get this HPV vaccine and when? HPV vaccine is given as a 3-dose series  1st Dose: Now  2nd Dose: 1 to 2 months after Dose 1  3rd Dose: 6 months after Dose 1 Additional (booster) doses are not recommended. Routine  vaccination  This HPV vaccine is recommended for girls and boys 11 or 21 years of age. It may be given starting at age 9. Why is HPV vaccine recommended at 11 or 21 years of age?  HPV infection is easily acquired, even with only one sex partner. That is why it is important to get HPV vaccine before any sexual contact takes place. Also, response to the vaccine is better at this age than at older ages. Catch-up vaccination This vaccine is recommended for the following people who have not completed the 3-dose series:   Females 13 through 21 years of age.  Males 13 through 21 years of age. This vaccine may be given to men 22 through 21 years of age who have not completed the 3-dose series. It is recommended for men through age 26 who have sex with men or whose immune system is weakened because of HIV infection, other illness, or medications.  HPV vaccine may be given at the same time as other vaccines. 4. Some people should not get HPV vaccine or should wait.  Anyone who has ever had a life-threatening allergic reaction to any component of HPV vaccine, or to a previous dose of HPV vaccine, should not get the vaccine. Tell your doctor if the person getting vaccinated has any severe allergies, including an allergy to yeast.  HPV vaccine is not recommended for pregnant women. However, receiving HPV vaccine when pregnant is not a reason to consider terminating the pregnancy. Women who are breast   feeding may get the vaccine.  People who are mildly ill when a dose of HPV is planned can still be vaccinated. People with a moderate or severe illness should wait until they are better. 5. What are the risks from this vaccine? This HPV vaccine has been used in the [place] and around the world for about six years and has been very safe. However, any medicine could possibly cause a serious problem, such as a severe allergic reaction. The risk of any vaccine causing a serious injury, or death, is extremely  small. Life-threatening allergic reactions from vaccines are very rare. If they do occur, it would be within a few minutes to a few hours after the vaccination. Several mild to moderate problems are known to occur with this HPV vaccine. These do not last long and go away on their own.  Reactions in the arm where the shot was given:  Pain (about 8 people in 10)  Redness or swelling (about 1 person in 4)  Fever:  Mild (100 F) (about 1 person in 10)  Moderate (102 F) (about 1 person in 4865)  Other problems:  Headache (about 1 person in 3)  Fainting: Brief fainting spells and related symptoms (such as jerking movements) can happen after any medical procedure, including vaccination. Sitting or lying down for about 15 minutes after a vaccination can help prevent fainting and injuries caused by falls. Tell your doctor if the patient feels dizzy or light-headed, or has vision changes or ringing in the ears.  Like all vaccines, HPV vaccines will continue to be monitored for unusual or severe problems. 6. What if there is a serious reaction? What should I look for?  Look for anything that concerns you, such as signs of a severe allergic reaction, very high fever, or behavior changes. Signs of a severe allergic reaction can include hives, swelling of the face and throat, difficulty breathing, a fast heartbeat, dizziness, and weakness. These would start a few minutes to a few hours after the vaccination.  What should I do?  If you think it is a severe allergic reaction or other emergency that can't wait, call 9-1-1 or get the person to the nearest hospital. Otherwise, call your doctor.  Afterward, the reaction should be reported to the Vaccine Adverse Event Reporting System (VAERS). Your doctor might file this report, or you can do it yourself through the VAERS web site at www.vaers.LAgents.nohhs.gov, or by calling 1-2062003539. VAERS is only for reporting reactions. They do not give medical  advice. 7. The National Vaccine Injury Compensation Program  The [employer] Vaccine Injury Compensation Program (VICP) is a federal program that was created to compensate people who may have been injured by certain vaccines.  Persons who believe they may have been injured by a vaccine can learn about the program and about filing a claim by calling 1-605-750-2773 or visiting the VICP website at SpiritualWord.atwww.hrsa.gov/vaccinecompensation. 8. How can I learn more?  Ask your doctor.  Call your local or state health department.  Contact the Centers for Disease Control and Prevention (CDC):  Call (630) 148-79081-(380)608-6753 (1-800-CDC-INFO)  or  Visit CDC's website at PicCapture.uywww.cdc.gov/vaccines CDC Human Papillomavirus (HPV) Gardasil (Interim) 05/11/12   This information is not intended to replace advice given to you by your health care provider. Make sure you discuss any questions you have with your health care provider.   Document Released: 10/09/2006 Document Revised: 01/02/2015 Document Reviewed: 01/23/2014 Elsevier Interactive Patient Education 2016 [employer] Meningococcal ACWY Vaccines--MenACWY and MPSV4:  1.  Why get vaccinated? Meningococcal disease is a serious illness caused by a type of bacteria called Neisseria meningitidis. It can lead to meningitis (infection of the lining of the brain and spinal cord) and infections of the blood. Meningococcal disease often occurs without warning--even among people who are otherwise healthy. Meningococcal disease can spread from person to person through close contact (coughing or kissing) or lengthy contact, especially among people living in the same household. There are at least 12 types of N. meningitidis, called "serogroups." Serogroups A, B, C, W, and Y cause most meningococcal disease. Anyone can get meningococcal disease but certain people are at increased risk, including:  Infants younger than one year old  Adolescents and young adults 39 through 21 years  old  People with certain medical conditions that affect the immune system  Microbiologists who routinely work with isolates of N. meningitidis  People at risk because of an outbreak in their community Even when it is treated, meningococcal disease kills 10 to 15 infected people out of 100. And of those who survive, about 10 to 20 out of every 100 will suffer disabilities such as hearing loss, brain damage, kidney damage, amputations, nervous system problems, or severe scars from skin grafts. Meningococcal ACWY vaccines can help prevent meningococcal disease caused by serogroups A, C, W, and Y. A different meningococcal vaccine is available to help protect against serogroup B. 2. Meningococcal ACWY Vaccines There are two kinds of meningococcal vaccines licensed by the Food and Drug Administration (FDA) for protection against serogroups A, C, W, and Y: meningococcal conjugate vaccine (MenACWY) and meningococcal polysaccharide vaccine (MPSV4). Two doses of MenACWY are routinely recommended for adolescents 11 through 21 years old: the first dose at 57 or 21 years old, with a booster dose at age 44. Some adolescents, including those with HIV, should get additional doses. Ask your health care provider for more information. In addition to routine vaccination for adolescents, MenACWY vaccine is also recommended for certain groups of people:  People at risk because of a serogroup A, C, W, or Y meningococcal disease outbreak  Anyone whose spleen is damaged or has been removed  Anyone with a rare immune system condition called "persistent complement component deficiency"  Anyone taking a drug called eculizumab (also called Soliris)  Microbiologists who routinely work with isolates of N. meningitidis  Anyone traveling to, or living in, a part of the world where meningococcal disease is common, such as parts of [place], broadcasting/film/video freshmen living in dormitories  [place] [employer] [place] Children between 2  and 30 months old, and people with certain medical conditions need multiple doses for adequate protection. Ask your health care provider about the number and timing of doses, and the need for booster doses. MenACWY is the preferred vaccine for people in these groups who are 2 months through 21 years old, have received MenACWY previously, or anticipate requiring multiple doses. MPSV4 is recommended for adults older than 55 who anticipate requiring only a single dose (travelers, or during community outbreaks). 3. Some people should not get this vaccine Tell the person who is giving you the vaccine:  If you have any severe, life-threatening allergies. If you have ever had a life-threatening allergic reaction after a previous dose of meningococcal ACWY vaccine, or if you have a severe allergy to any part of this vaccine, you should not get this vaccine. Your provider can tell you about the vaccine's ingredients.  If you are pregnant or breastfeeding. There is not very much information about  the potential risks of this vaccine for a pregnant woman or breastfeeding mother. It should be used during pregnancy only if clearly needed. If you have a mild illness, such as a cold, you can probably get the vaccine today. If you are moderately or severely ill, you should probably wait until you recover. Your doctor can advise you. 4. Risks of a vaccine reaction With any medicine, including vaccines, there is a chance of side effects. These are usually mild and go away on their own within a few days, but serious reactions are also possible. As many as half of the people who get meningococcal ACWY vaccine have mild problems following vaccination, such as redness or soreness where the shot was given. If these problems occur, they usually last for 1 or 2 days. They are more common after MenACWY than after MPSV4. A small percentage of people who receive the vaccine develop a mild fever. Problems that could happen after  any injected vaccine:  People sometimes faint after a medical procedure, including vaccination. Sitting or lying down for about 15 minutes can help prevent fainting, and injuries caused by a fall. Tell your doctor if you feel dizzy, or have vision changes or ringing in the ears.  Some people get severe pain in the shoulder and have difficulty moving the arm where a shot was given. This happens very rarely.  Any medication can cause a severe allergic reaction. Such reactions from a vaccine are very rare, estimated at about 1 in a million doses, and would happen within a few minutes to a few hours after the vaccination. As with any medicine, there is a very remote chance of a vaccine causing a serious injury or death. The safety of vaccines is always being monitored. For more information, visit: http://floyd.org/ 5. What if there is a serious reaction? What should I look for?  Look for anything that concerns you, such as signs of a severe allergic reaction, very high fever, or unusual behavior. Signs of a severe allergic reaction can include hives, swelling of the face and throat, difficulty breathing, a fast heartbeat, dizziness, and weakness--usually within a few minutes to a few hours after the vaccination. What should I do?  If you think it is a severe allergic reaction or other emergency that can't wait, call 9-1-1 and get to the nearest hospital. Otherwise, call your doctor.  Afterward, the reaction should be reported to the "Vaccine Adverse Event Reporting System" (VAERS). Your doctor should file this report, or you can do it yourself through the VAERS web site at www.vaers.LAgents.no, or by calling 1-507-817-6690. VAERS does not give medical advice. 6. The National Vaccine Injury Compensation Program The [employer] Vaccine Injury Compensation Program (VICP) is a federal program that was created to compensate people who may have been injured by certain vaccines. Persons who believe  they may have been injured by a vaccine can learn about the program and about filing a claim by calling 1-229-851-6794 or visiting the VICP website at SpiritualWord.at. There is a time limit to file a claim for compensation. 7. How can I learn more?  Ask your health care provider. He or she can give you the vaccine package insert or suggest other sources of information.  Call your local or state health department.  Contact the Centers for Disease Control and Prevention (CDC):  Call (636) 213-0719 (1-800-CDC-INFO) or  Visit CDC's website at PicCapture.uy Vaccine Information Statement Meningococcal ACWY Vaccines (03/26/2015)   This information is not intended to replace advice given  to you by your health care provider. Make sure you discuss any questions you have with your health care provider.   Document Released: 10/09/2006 Document Revised: 04/28/2015 Document Reviewed: 04/03/2013 Elsevier Interactive Patient Education [employer]

## 2016-05-31 NOTE — Progress Notes (Signed)
BP 128/84 mmHg  Pulse 74  Temp(Src) 97.7 F (36.5 C)  Ht  (1.575 m)  Wt 138 lb 9.6 oz (62.869 kg)  BMI 25.34 kg/m2  SpO2 98%  LMP 05/25/2016 (Exact Date)   Subjective:    Patient ID: Claudia Hanson, female    DOB: 1996-12-08, 20 y.o.   MRN: [id number]  HPI: Claudia Hanson is a 20 y.o. female  Chief Complaint  Patient presents with  . Follow-up    2 week f/u   Pt is here to f/u of her rib pain.  States pain comes and goes.  This seems to be worse when stretching and in the AM.  [place] was normal.  Has not gotten rib films.  Chest x-ray was normal  Relevant past medical, surgical, family and social history reviewed and updated as indicated. Interim medical history since our last visit reviewed. Allergies and medications reviewed and updated.  Review of Systems  Per HPI unless specifically indicated above     Objective:    BP 128/84 mmHg  Pulse 74  Temp(Src) 97.7 F (36.5 C)  Ht  (1.575 m)  Wt 138 lb 9.6 oz (62.869 kg)  BMI 25.34 kg/m2  SpO2 98%  LMP 05/25/2016 (Exact Date)  Wt Readings from Last 3 Encounters:  05/31/16 138 lb 9.6 oz (62.869 kg)  05/17/16 139 lb 3.2 oz (63.141 kg)  03/25/16 137 lb 3.2 oz (62.234 kg)    Physical Exam  Constitutional: She is oriented to person, place, and time. She appears well-developed and well-nourished. No distress.  HENT:  Head: Normocephalic and atraumatic.  Eyes: Conjunctivae and lids are normal. Right eye exhibits no discharge. Left eye exhibits no discharge. No scleral icterus.  Cardiovascular: Normal rate.   Pulmonary/Chest: Effort normal.  Abdominal: Normal appearance. There is no splenomegaly or hepatomegaly.  Musculoskeletal: Normal range of motion.  Neurological: She is alert and oriented to person, place, and time.  Skin: Skin is intact. No rash noted. No pallor.  Psychiatric: She has a normal mood and affect. Her behavior is normal. Judgment and thought content normal.    Results for orders placed or  performed in visit on 05/17/16  CBC with Differential/Platelet  Result Value Ref Range   WBC 8.5 3.4 - 10.8 x10E3/uL   RBC 4.13 3.77 - 5.28 x10E6/uL   Hemoglobin 12.3 11.1 - 15.9 g/dL   Hematocrit 78.2 95.6 - 46.6 %   MCV 91 79 - 97 fL   MCH 29.8 26.6 - 33.0 pg   MCHC 32.6 31.5 - 35.7 g/dL   RDW 21.3 08.6 - 57.8 %   Platelets 355 150 - 379 x10E3/uL   Neutrophils 64 %   Lymphs 29 %   Monocytes 5 %   Eos 2 %   Basos 0 %   Neutrophils Absolute 5.4 1.4 - 7.0 x10E3/uL   Lymphocytes Absolute 2.5 0.7 - 3.1 x10E3/uL   Monocytes Absolute 0.4 0.1 - 0.9 x10E3/uL   EOS (ABSOLUTE) 0.2 0.0 - 0.4 x10E3/uL   Basophils Absolute 0.0 0.0 - 0.2 x10E3/uL   Immature Granulocytes 0 %   Immature Grans (Abs) 0.0 0.0 - 0.1 x10E3/uL      Assessment & Plan:   Problem List Items Addressed This Visit      Unprioritized   Constipation    This is getting better.  Has not tried Miralax.        Rib pain on right side - Primary    I would still like her  to get her rib films.  This seems muscular.  Discussed intermittent use of Ibuprofen or Tylenol for relief.         Other Visit Diagnoses    Routine general medical examination at a health care facility        Relevant Orders    HPV vaccine bivalent 3 dose IM    Meningococcal conjugate vaccine 4-valent IM        Follow up plan: No Follow-up on file.

## 2016-05-31 NOTE — Assessment & Plan Note (Signed)
This is getting better.  Has not tried Miralax.

## 2016-05-31 NOTE — Assessment & Plan Note (Signed)
I would still like her to get her rib films.  This seems muscular.  Discussed intermittent use of Ibuprofen or Tylenol for relief.

## 2016-06-06 ENCOUNTER — Ambulatory Visit
Admission: RE | Admit: 2016-06-06 | Discharge: 2016-06-06 | Disposition: A | Discharge status: Home / Self Care | Payer: Managed Care, Other (non HMO) | Source: Ambulatory Visit | Attending: Unknown Physician Specialty | Admitting: Unknown Physician Specialty

## 2016-06-06 DIAGNOSIS — R0781 Pleurodynia: Secondary | ICD-10-CM

## 2016-06-07 NOTE — Progress Notes (Signed)
Quick Note:  Normal labs. Pt notified through mychart ______ 

## 2016-06-08 ENCOUNTER — Emergency Department
Admission: EM | Admit: 2016-06-08 | Discharge: 2016-06-08 | Disposition: A | Discharge status: Home / Self Care | Payer: Managed Care, Other (non HMO) | Attending: Student | Admitting: Student

## 2016-06-08 ENCOUNTER — Encounter: Payer: Self-pay | Admitting: *Deleted

## 2016-06-08 DIAGNOSIS — R42 Dizziness and giddiness: Secondary | ICD-10-CM

## 2016-06-08 DIAGNOSIS — G478 Other sleep disorders: Secondary | ICD-10-CM

## 2016-06-08 DIAGNOSIS — G4753 Recurrent isolated sleep paralysis: Secondary | ICD-10-CM | POA: Diagnosis not present

## 2016-06-08 DIAGNOSIS — R519 Headache, unspecified: Secondary | ICD-10-CM

## 2016-06-08 DIAGNOSIS — R51 Headache: Secondary | ICD-10-CM | POA: Insufficient documentation

## 2016-06-08 LAB — URINALYSIS COMPLETE WITH MICROSCOPIC (ARMC ONLY)
Bacteria, UA: NONE SEEN
Bilirubin Urine: NEGATIVE
Glucose, UA: NEGATIVE mg/dL
Hgb urine dipstick: NEGATIVE
Nitrite: NEGATIVE
PROTEIN: NEGATIVE mg/dL
SPECIFIC GRAVITY, URINE: 1.019 (ref 1.005–1.030)
pH: 5 (ref 5.0–8.0)

## 2016-06-08 LAB — BASIC METABOLIC PANEL
Anion gap: 11 (ref 5–15)
BUN: 8 mg/dL (ref 6–20)
CALCIUM: 9 mg/dL (ref 8.9–10.3)
CO2: 20 mmol/L — ABNORMAL LOW (ref 22–32)
CREATININE: 0.74 mg/dL (ref 0.44–1.00)
Chloride: 105 mmol/L (ref 101–111)
Glucose, Bld: 165 mg/dL — ABNORMAL HIGH (ref 65–99)
Potassium: 3.5 mmol/L (ref 3.5–5.1)
SODIUM: 136 mmol/L (ref 135–145)

## 2016-06-08 LAB — CBC
HCT: 36.4 % (ref 35.0–47.0)
Hemoglobin: 12.4 g/dL (ref 12.0–16.0)
MCH: 30.3 pg (ref 26.0–34.0)
MCHC: 34.2 g/dL (ref 32.0–36.0)
MCV: 88.6 fL (ref 80.0–100.0)
PLATELETS: 271 10*3/uL (ref 150–440)
RBC: 4.11 MIL/uL (ref 3.80–5.20)
RDW: 12.8 % (ref 11.5–14.5)
WBC: 9.9 10*3/uL (ref 3.6–11.0)

## 2016-06-08 LAB — POCT PREGNANCY, URINE: PREG TEST UR: NEGATIVE

## 2016-06-08 NOTE — ED Notes (Signed)
Arrives with complaints of dizziness and feeling as if she was "floating" yesterday when she woke up, states pressure in her head and ear pain, states when she woke up she had "sleep paralysis", and right rib pain, pt awake and alert in no acute distress, states right eye numbness

## 2016-06-08 NOTE — ED Provider Notes (Addendum)
[facility] Emergency Department Provider Note   ____________________________________________  Time seen: Approximately 3:11 PM  I have reviewed the triage vital signs and the nursing notes.   HISTORY  Chief Complaint Dizziness    HPI Claudia Hanson is a 20 y.o. female with history of anxiety, no other chronic medical problems who presents with intermittent lightheadedness, mild ear pain and headache since last night as well as an episode of sleep paralysis, intermittent, mild, no modifying factors. Patient reports that she just started working third shift. When she awoke from sleep yesterday evening to go to work, she had an episode where she was "awake but I couldn't move my body for a few minutes". This resolved spontaneously. She went to work and began having some mild headache as well as bilateral ear pain, right greater than left. She felt as if she was "numb behind my right eye", but denies any vision change. She took an over-the-counter pain reliever which she clinically improves her headache and her numbness went away. She works all night and came to the emergency department for evaluation. Currently she just feels tired. She denies any chest pain or difficulty breathing. No numbness or weakness in the arms or legs, no vomiting, diarrhea, fevers or chills. No syncope. She denies room spinning dizziness.   History reviewed. No pertinent past medical history.  Patient Active Problem List   Diagnosis Date Noted  . Rib pain on right side 05/31/2016  . Constipation 05/17/2016  . Chest pain syndrome 12/29/2015  . Bilateral chronic serous otitis media 12/07/2015  . Allergic rhinitis 12/07/2015    History reviewed. No pertinent past surgical history.  Current Outpatient Rx  Name  Route  Sig  Dispense  Refill  . Norethindrone Acetate-Ethinyl Estradiol (JUNEL 1.5/30) 1.5-30 MG-MCG tablet   Oral   Take 1 tablet by mouth daily.          . Vitamin D,  Ergocalciferol, (DRISDOL) 50000 units CAPS capsule   Oral   Take 1 capsule (50,000 Units total) by mouth every 7 (seven) days.   12 capsule   0     Allergies Review of patient's allergies indicates no known allergies.  Family History  Problem Relation Age of Onset  . Diabetes Paternal Grandmother   . Heart disease Paternal Grandfather     MI    Social History Social History  Substance Use Topics  . Smoking status: Never Smoker   . Smokeless tobacco: Never Used  . Alcohol Use: No    Review of Systems Constitutional: No fever/chills Eyes: No visual changes. ENT: No sore throat. Cardiovascular: Denies chest pain. Respiratory: Denies shortness of breath. Gastrointestinal: No abdominal pain.  No nausea, no vomiting.  No diarrhea.  No constipation. Genitourinary: Negative for dysuria. Musculoskeletal: Negative for back pain. Skin: Negative for rash. Neurological: Positive for headache, no focal weakness, +resolved "numbness behind my right eye"   10-point ROS otherwise negative.  ____________________________________________   PHYSICAL EXAM:  Filed Vitals:   06/08/16 1329 06/08/16 1547  BP: 139/90 121/78  Pulse: 100 95  Temp: 99 F (37.2 C)   TempSrc: Oral   Resp: 18 15  Height:  (1.575 m)   Weight: 138 lb (62.596 kg)   SpO2: 100% 100%    VITAL SIGNS: ED Triage Vitals  Enc Vitals Group     BP 06/08/16 1329 139/90 mmHg     Pulse Rate 06/08/16 1329 100     Resp 06/08/16 1329 18  Temp 06/08/16 1329 99 F (37.2 C)     Temp Source 06/08/16 1329 Oral     SpO2 06/08/16 1329 100 %     Weight 06/08/16 1329 138 lb (62.596 kg)     Height 06/08/16 1329 5\' 2"  (1.575 m)     Head Cir --      Peak Flow --      Pain Score 06/08/16 1330 7     Pain Loc --      Pain Edu? --      Excl. in GC? --     Constitutional: Alert and oriented. Well appearing and in no acute distress. Eyes: Conjunctivae are normal. PERRL. EOMI. Head: Atraumatic. Nose: No  congestion/rhinnorhea. Ears: TMs clear bilaterally. Mouth/Throat: Mucous membranes are moist.  Oropharynx non-erythematous. Neck: No stridor.  No cervical spine tenderness to palpation. Cardiovascular: Normal rate, regular rhythm. Grossly normal heart sounds.  Good peripheral circulation. Respiratory: Normal respiratory effort.  No retractions. Lungs CTAB. Gastrointestinal: Soft and nontender. No distention. No CVA tenderness. Genitourinary: deferred Musculoskeletal: No lower extremity tenderness nor edema.  No joint effusions. Neurologic:  Normal speech and language. No gross focal neurologic deficits are appreciated. No gait instability. 5 out of 5 strength bilateral upper and lower extremity, sensation intact to light touch throughout, cranial nerves II through XII intact, normal finger-nose-finger Skin:  Skin is warm, dry and intact. No rash noted. Psychiatric: Mood and affect are normal. Speech and behavior are normal.  ____________________________________________   LABS (all labs ordered are listed, but only abnormal results are displayed)  Labs Reviewed  BASIC METABOLIC PANEL - Abnormal; Notable for the following:    CO2 20 (*)    Glucose, Bld 165 (*)    All other components within normal limits  URINALYSIS COMPLETEWITH MICROSCOPIC ([facility] ONLY) - Abnormal; Notable for the following:    Color, Urine YELLOW (*)    APPearance HAZY (*)    Ketones, ur 2+ (*)    Leukocytes, UA TRACE (*)    Squamous Epithelial / LPF 6-30 (*)    All other components within normal limits  CBC  POC URINE PREG, ED  POCT PREGNANCY, URINE   ____________________________________________  EKG  ED ECG REPORT I, Gayla DossGayle, Lexy Meininger A, the attending physician, personally viewed and interpreted this ECG.   Date: 06/08/2016  EKG Time: 15:46  Rate: 96  Rhythm: normal sinus rhythm  Axis: normal  Intervals:none  ST&T Change: No acute ST elevation or ST depression. Isolated T-wave inversion in  V2.  ____________________________________________  RADIOLOGY  none ____________________________________________   PROCEDURES  Procedure(s) performed: None  Critical Care performed: No  ____________________________________________   INITIAL IMPRESSION / ASSESSMENT AND PLAN / ED COURSE  Pertinent labs & imaging results that were available during my care of the patient were reviewed by me and considered in my medical decision making (see chart for details).  Claudia Hanson is a 20 y.o. female who presents with intermittent lightheadedness, mild ear pain and headache since last night as well as an episode of sleep paralysis, intermittent, mild, no modifying factors. On exam, she is very well-appearing and in no acute distress, her vital signs are stable she is afebrile. She has an intact neurological examination and appears well. Her EKG is reassuring, I reviewed her labs, CBC is unremarkable. BMP with very mild decrease in her CO2 and she has ketones in her urine and I suspect that she may be mildly dehydrated. She barely eats or drinks while she is working and she worked  all throughout the night and came here for evaluation when she got off of her shift. She has a normal anion gap, her glucose is 165 clinical picture  is not consistent with DKA. I doubt any acute life-threatening process in this patient, doubt any acute intracranial process such as CVA. Question possibly a complicated migraine given headache and resolved "numbness behind my eyes". We discussed the importance of adequate hydration, diet, sleep hygiene, we discussed meticulous return precautions and need for close PCP follow-up and she is comfortable with the discharge plan. DC home. ____________________________________________   FINAL CLINICAL IMPRESSION(S) / ED DIAGNOSES  Final diagnoses:  Lightheaded  Acute nonintractable headache, unspecified headache type  Sleep paralysis      NEW MEDICATIONS STARTED DURING THIS  VISIT:  New Prescriptions   No medications on file     Note:  This document was prepared using Dragon voice recognition software and may include unintentional dictation errors.    Gayla Doss, MD 06/08/16 1610  Gayla Doss, MD 06/08/16 [phone]

## 2016-06-08 NOTE — ED Notes (Signed)
Pt to ED today due to dizziness that started yesterday.  Pt endorses some nausea, denies vomiting.  She states "light-headedness", but the room is not spinning.  Denies falling.

## 2016-06-20 ENCOUNTER — Emergency Department (HOSPITAL_COMMUNITY)
Admission: EM | Admit: 2016-06-20 | Discharge: 2016-06-20 | Disposition: A | Discharge status: Home / Self Care | Payer: Managed Care, Other (non HMO) | Attending: Emergency Medicine | Admitting: Emergency Medicine

## 2016-06-20 ENCOUNTER — Other Ambulatory Visit: Payer: Self-pay | Admitting: Unknown Physician Specialty

## 2016-06-20 ENCOUNTER — Encounter (HOSPITAL_COMMUNITY): Payer: Self-pay

## 2016-06-20 DIAGNOSIS — Z79899 Other long term (current) drug therapy: Secondary | ICD-10-CM | POA: Diagnosis not present

## 2016-06-20 DIAGNOSIS — R51 Headache: Secondary | ICD-10-CM | POA: Diagnosis present

## 2016-06-20 DIAGNOSIS — R519 Headache, unspecified: Secondary | ICD-10-CM

## 2016-06-20 MED ORDER — SUMATRIPTAN SUCCINATE 25 MG PO TABS: 50.0000 mg | ORAL_TABLET | ORAL | Status: DC | PRN

## 2016-06-20 MED ORDER — KETOROLAC TROMETHAMINE 60 MG/2ML IM SOLN
60.0000 mg | Freq: Once | INTRAMUSCULAR | Status: AC
Start: 2016-06-20 — End: 2016-06-20
  Administered 2016-06-20: 60 mg via INTRAMUSCULAR
  Filled 2016-06-20: qty 2

## 2016-06-20 MED ORDER — IBUPROFEN 800 MG PO TABS: 800.0000 mg | ORAL_TABLET | Freq: Three times a day (TID) | ORAL | Status: DC

## 2016-06-20 NOTE — ED Notes (Signed)
Per Pt, Pt reports having headache for about five days. Pt complains of floaters and double vision with ear pain and stiff neck. Pt is ambulatory and no acute distress at this time.

## 2016-06-20 NOTE — Discharge Instructions (Signed)

## 2016-06-20 NOTE — ED Provider Notes (Signed)
CSN: [id number]     Arrival date & time 06/20/16  1410 History   First MD Initiated Contact with Patient 06/20/16 2119     Chief Complaint  Patient presents with  . Headache     (Consider location/radiation/quality/duration/timing/severity/associated sxs/prior Treatment) HPI Comments: The patient is a 20 year old female, she is currently in college, she works third shift in a warehouse, she has had headaches on and off intermittently for some time but seems to be worse over the last 4 days. She reports having normal CT scan of the brain in November at another hospital, she was seen 1 week ago at [facility] for dizziness headaches and numbness across her face. She states that the symptoms have fluctuated over the last week and she continues to have them. She states that she is very anxious, she has an underlying anxiety for which she undergoes therapy during school. She does not take any medications for that however she does take ibuprofen intermittently for headaches which she does not feel helps that much. She denies numbness, weakness of her arms or legs, no ataxia, no blurred vision, no difficulty with speech.  Patient is a 20 y.o. female presenting with headaches. The history is provided by the patient and medical records.  Headache   History reviewed. No pertinent past medical history. History reviewed. No pertinent past surgical history. Family History  Problem Relation Age of Onset  . Diabetes Paternal Grandmother   . Heart disease Paternal Grandfather     MI   Social History  Substance Use Topics  . Smoking status: Never Smoker   . Smokeless tobacco: Never Used  . Alcohol Use: No   OB History    No data available     Review of Systems  Neurological: Positive for headaches.  All other systems reviewed and are negative.     Allergies  Review of patient's allergies indicates no known allergies.  Home Medications   Prior to Admission medications   Medication  Sig Start Date End Date Taking? Authorizing Provider  Norethindrone Acetate-Ethinyl Estradiol (JUNEL 1.5/30) 1.5-30 MG-MCG tablet Take 1 tablet by mouth daily.  04/14/16  Yes Historical Provider, MD  Vitamin D, Ergocalciferol, (DRISDOL) 50000 units CAPS capsule TAKE 1 CAPSULE (50,000 UNITS TOTAL) BY MOUTH EVERY 7 (SEVEN) DAYS. 06/20/16  Yes Gabriel Cirriheryl Wicker, NP  ibuprofen (ADVIL,MOTRIN) 800 MG tablet Take 1 tablet (800 mg total) by mouth 3 (three) times daily. 06/20/16   Eber HongBrian Ellamarie Naeve, MD  SUMAtriptan (IMITREX) 25 MG tablet Take 2 tablets (50 mg total) by mouth every 2 (two) hours as needed for migraine (ongoing headache). Maximum daily dose 200mg  06/20/16   Eber HongBrian Galileo Colello, MD   BP 128/90 mmHg  Pulse 91  Temp(Src) 98.3 F (36.8 C) (Oral)  Resp 18  Ht 5\' 2"  (1.575 m)  Wt 136 lb (61.689 kg)  BMI 24.87 kg/m2  SpO2 100%  LMP 05/25/2016 (Exact Date) Physical Exam  Constitutional: She appears well-developed and well-nourished. No distress.  HENT:  Head: Normocephalic and atraumatic.  Mouth/Throat: Oropharynx is clear and moist. No oropharyngeal exudate.  Eyes: Conjunctivae and EOM are normal. Pupils are equal, round, and reactive to light. Right eye exhibits no discharge. Left eye exhibits no discharge. No scleral icterus.  Neck: Normal range of motion. Neck supple. No JVD present. No thyromegaly present.  Cardiovascular: Normal rate, regular rhythm, normal heart sounds and intact distal pulses.  Exam reveals no gallop and no friction rub.   No murmur heard. Pulmonary/Chest: Effort normal and breath  sounds normal. No respiratory distress. She has no wheezes. She has no rales.  Abdominal: Soft. Bowel sounds are normal. She exhibits no distension and no mass. There is no tenderness.  Musculoskeletal: Normal range of motion. She exhibits no edema or tenderness.  Lymphadenopathy:    She has no cervical adenopathy.  Neurological: She is alert. Coordination normal.  Speech is clear, cranial nerves III  through XII are intact, memory is intact, strength is normal in all 4 extremities including grips, sensation is intact to light touch and pinprick in all 4 extremities. Coordination as tested by finger-nose-finger is normal, no limb ataxia. Normal gait, normal reflexes at the patellar tendons bilaterally  Skin: Skin is warm and dry. No rash noted. No erythema.  Psychiatric: She has a normal mood and affect. Her behavior is normal.  Nursing note and vitals reviewed.   ED Course  Procedures (including critical care time) Labs Review Labs Reviewed - No data to display  Imaging Review No results found. I have personally reviewed and evaluated these images and lab results as part of my medical decision-making.    MDM   Final diagnoses:  Nonintractable headache, unspecified chronicity pattern, unspecified headache type    The patient is unremarkable in appearance, normal vital signs, no history of aneurysm, no aneurysm seen in November of last year on CT scan according to the patient's report. She does not have any visual symptoms at this time and with totally normal cranial nerves and peripheral nerves I doubt this is a central process. She'll be given some Toradol and a prescription for Imitrex with instructions to follow-up with neurology. The patient is in total agreement with this plan.  Improved, stable for d/c.  Meds given in ED:  Medications  ketorolac (TORADOL) injection 60 mg (60 mg Intramuscular Given 06/20/16 2141)    New Prescriptions   IBUPROFEN (ADVIL,MOTRIN) 800 MG TABLET    Take 1 tablet (800 mg total) by mouth 3 (three) times daily.   SUMATRIPTAN (IMITREX) 25 MG TABLET    Take 2 tablets (50 mg total) by mouth every 2 (two) hours as needed for migraine (ongoing headache). Maximum daily dose 200mg       Eber HongBrian Zyshonne Malecha, MD 06/20/16 2240

## 2016-08-09 ENCOUNTER — Encounter: Payer: Self-pay | Admitting: Unknown Physician Specialty

## 2016-08-12 ENCOUNTER — Ambulatory Visit (INDEPENDENT_AMBULATORY_CARE_PROVIDER_SITE_OTHER): Payer: Managed Care, Other (non HMO)

## 2016-08-12 DIAGNOSIS — Z23 Encounter for immunization: Secondary | ICD-10-CM | POA: Diagnosis not present

## 2016-09-23 ENCOUNTER — Ambulatory Visit (INDEPENDENT_AMBULATORY_CARE_PROVIDER_SITE_OTHER): Payer: Managed Care, Other (non HMO) | Admitting: Family Medicine

## 2016-09-23 ENCOUNTER — Ambulatory Visit: Payer: Self-pay | Admitting: Unknown Physician Specialty

## 2016-09-23 ENCOUNTER — Encounter: Payer: Self-pay | Admitting: Family Medicine

## 2016-09-23 VITALS — BP 124/81 | HR 77 | Temp 98.4°F | Wt 141.0 lb

## 2016-09-23 DIAGNOSIS — R1011 Right upper quadrant pain: Secondary | ICD-10-CM

## 2016-09-23 DIAGNOSIS — H9203 Otalgia, bilateral: Secondary | ICD-10-CM | POA: Diagnosis not present

## 2016-09-23 MED ORDER — SUCRALFATE 1 G PO TABS: 1.0000 g | ORAL_TABLET | Freq: Three times a day (TID) | ORAL | 0 refills | Status: DC

## 2016-09-23 MED ORDER — FLUTICASONE PROPIONATE 50 MCG/ACT NA SUSP: 2.0000 | Freq: Every day | NASAL | 6 refills | Status: DC

## 2016-09-23 MED ORDER — OMEPRAZOLE 20 MG PO CPDR: 20.0000 mg | DELAYED_RELEASE_CAPSULE | Freq: Every day | ORAL | 3 refills | Status: DC

## 2016-09-23 NOTE — Patient Instructions (Signed)
Follow up as needed

## 2016-09-23 NOTE — Progress Notes (Signed)
BP 124/81   Pulse 77   Temp 98.4 F (36.9 C)   Wt 141 lb (64 kg)   LMP 09/21/2016 (Exact Date)   SpO2 100%   BMI 25.79 kg/m    Subjective:    Patient ID: Claudia Hanson Doerner, female    DOB: December 18, 1996, 20 y.o.   MRN: [id number]  HPI: Claudia Hanson Settle is a 20 y.o. female  Chief Complaint  Patient presents with  . Gastroesophageal Reflux    x 1 month. burns in her upper right abdomen, but not always after she eats. Is off and on, but happens almost every day. Tried Tums but it didn't help.  . Ear Pain    sharp left ear pain off and on for a while.   Patient presents with non-specific RUQ pain that has been ongoing for many months. Denies N/V/D, change in bowel movements, fever, chills. Taking TUMS with no relief. Has had an extensive workup in the past, and has been told that it was likely musculoskeletal in nature. Rib films negative, abdominal u/s negative, labs normal. States the pain seems to occur randomely, sometimes when eating, sometimes when waking up in the morning. Burning, numb/tingling sensations sometimes in the area.    Ear pain and tinnitus for several months. Sharp pain in the left ear, ringing since 2 weeks ago in the right ear.   Relevant past medical, surgical, family and social history reviewed and updated as indicated. Interim medical history since our last visit reviewed. Allergies and medications reviewed and updated.  Review of Systems  Constitutional: Negative.   HENT: Positive for ear pain and tinnitus.   Eyes: Negative.   Respiratory: Negative.   Cardiovascular: Negative.   Gastrointestinal: Positive for abdominal pain.  Genitourinary: Negative.   Musculoskeletal: Negative.   Skin: Negative.   Neurological: Negative.   Psychiatric/Behavioral: Negative.     Per HPI unless specifically indicated above     Objective:    BP 124/81   Pulse 77   Temp 98.4 F (36.9 C)   Wt 141 lb (64 kg)   LMP 09/21/2016 (Exact Date)   SpO2 100%   BMI 25.79 kg/m     Wt Readings from Last 3 Encounters:  09/23/16 141 lb (64 kg)  06/20/16 136 lb (61.7 kg)  06/08/16 138 lb (62.6 kg)    Physical Exam  Constitutional: She appears well-developed and well-nourished. No distress.  HENT:  Head: Normocephalic and atraumatic.  Eyes: Conjunctivae are normal. Pupils are equal, round, and reactive to light. No scleral icterus.  Neck: Normal range of motion. Neck supple.  Cardiovascular: Normal rate, regular rhythm and normal heart sounds.   Pulmonary/Chest: Effort normal and breath sounds normal.  Abdominal: Soft. Bowel sounds are normal. She exhibits no distension and no mass. There is no tenderness. There is no rebound and no guarding.  No Murphy's sign on exam  Musculoskeletal: Normal range of motion.  Neurological: She is alert.  Skin: Skin is warm and dry.  Psychiatric: She has a normal mood and affect. Her behavior is normal.  Nursing note and vitals reviewed.     Assessment & Plan:   Problem List Items Addressed This Visit    None    Visit Diagnoses    RUQ abdominal pain    -  Primary   All previous work-ups negative, will refer to GI for further workup if necessary. Omeprazole and carafate until then if it is providing any relief.    Relevant Orders   Ambulatory referral  to Gastroenterology   Ear pain, bilateral       Exam benign, possibly caused by fluid intermittently. Will send in flonase for a trial. Instructed on proper technique for use.        Follow up plan: Return if symptoms worsen or fail to improve.

## 2016-09-29 ENCOUNTER — Ambulatory Visit (INDEPENDENT_AMBULATORY_CARE_PROVIDER_SITE_OTHER): Payer: Managed Care, Other (non HMO) | Admitting: Nurse Practitioner

## 2016-09-29 ENCOUNTER — Encounter: Payer: Self-pay | Admitting: Nurse Practitioner

## 2016-09-29 ENCOUNTER — Other Ambulatory Visit (INDEPENDENT_AMBULATORY_CARE_PROVIDER_SITE_OTHER): Payer: Managed Care, Other (non HMO)

## 2016-09-29 VITALS — BP 108/80 | Ht 62.5 in | Wt 140.0 lb

## 2016-09-29 DIAGNOSIS — R1011 Right upper quadrant pain: Secondary | ICD-10-CM

## 2016-09-29 LAB — HEPATIC FUNCTION PANEL
ALBUMIN: 4.2 g/dL (ref 3.5–5.2)
ALT: 17 U/L (ref 0–35)
AST: 17 U/L (ref 0–37)
Alkaline Phosphatase: 42 U/L (ref 39–117)
Bilirubin, Direct: 0.1 mg/dL (ref 0.0–0.3)
TOTAL PROTEIN: 7.5 g/dL (ref 6.0–8.3)
Total Bilirubin: 0.7 mg/dL (ref 0.2–1.2)

## 2016-09-29 NOTE — Progress Notes (Signed)
HPI: Patient is a 20 year old female referred by PCP, Particia Nearing, [facility], for evaluation of RUQ pain.  Patient is here now for a several month history of nonradiating, burning in her right upper quadrant which is usually postprandial and dependent on diet. Pain definitely exacerbated by fatty foods.  It does not seem to be affected by stress, position, or activity. Tums do not help. She has some associated nausea relieved with eating.Patient was taking ibuprofen 1-3 times a week over the summer but none last couple of months.  Her weight is stable. Overall bowel movements are normal except she has occasional mucoid material present. She sometimes sees undigested food particles in her stool. No rectal bleeding.  Pertinent Epic info:   Dec 2016:  Seen in ED followed by PCP for chest pain . Chest x-ray, echocardiogram negative. Pain felt to be related asthma. Tried on Singulair and albuterol, back to PCP in February without improvement in chest pain. Felt to be anxiety related, referred to counseling.  Late May 2017 :Saw PCP late May with right rib pain, myalgias, and constipation. She had seen rheumatology for myalgias and workup negative per PCP notes. Ultrasound of the abdomen done for right rib sided pain, it was negative. Right rib films negative.  Past Medical History:  Diagnosis Date  . Vitamin D deficiency     History reviewed. No pertinent surgical history. Family History  Problem Relation Age of Onset  . Diabetes Paternal Grandmother   . Heart disease Paternal Grandfather     MI   Social History  Substance Use Topics  . Smoking status: Never Smoker  . Smokeless tobacco: Never Used  . Alcohol use No   Current Outpatient Prescriptions  Medication Sig Dispense Refill  . fluticasone (FLONASE) 50 MCG/ACT nasal spray Place 2 sprays into both nostrils daily. 16 g 6  . folic acid (FOLVITE) 1 MG tablet Take 2 mg by mouth 2 (two) times daily.  11  . Norethindrone  Acetate-Ethinyl Estradiol (JUNEL 1.5/30) 1.5-30 MG-MCG tablet Take 1 tablet by mouth daily.     . SUMAtriptan (IMITREX) 25 MG tablet Take 2 tablets (50 mg total) by mouth every 2 (two) hours as needed for migraine (ongoing headache). Maximum daily dose 200mg  30 tablet 0    No Known Allergies   Review of Systems: All systems reviewed and negative except where noted in HPI.   Physical Exam: BP 108/80   Ht 5' 2.5" (1.588 m)   Wt 140 lb (63.5 kg)   LMP 09/21/2016 (Exact Date)   BMI 25.20 kg/m  Constitutional: Pleasant,well-developed, black female in no acute distress. HEENT: Normocephalic and atraumatic. Conjunctivae are normal. No scleral icterus. Neck supple.  Cardiovascular: Normal rate, regular rhythm.  Pulmonary/chest: Effort normal and breath sounds normal. No wheezing, rales or rhonchi. Abdominal: Soft, nondistended, nontender. Bowel sounds active throughout. There are no masses palpable. No hepatomegaly. Extremities: no edema Lymphadenopathy: No cervical adenopathy noted. Neurological: Alert and oriented to person place and time. Skin: Skin is warm and dry. No rashes noted. Psychiatric: Normal mood and affect. Behavior is normal.   ASSESSMENT AND PLAN:  54. 21 year old female with few month history of nonradiating , burning , RUQ pain. Pain exacerbated by fatty foods. Pain better this week after making dietary modifications  Abdominal exam is not concerning, weight is stable. Ultrasound in May for right sided pain was negative. PCP has prescribed Carafate and Prilosec but patient decided to wait for GI evaluation before starting.  -  Recommend starting the daily PPI prescribed by PCP. She will try 1 every morning 30 minutes before breakfast for one month -Continue to avoid triggers such as fatty foods -Will check LFTs as they not been checked since March -Return visit in a month. She will call sooner if symptoms  don't improve, or worsen.   2.  Hx of chest pain   Dec 2016:   Seen in ED followed by PCP visit for chest pain . CXR, echo negative. Pain felt to be related asthma. Tried on Singulair and albuterol, back to PCP in February without improvement in chest pain. Felt to be anxiety related, referred to counseling.  Late May 2017 :Saw PCP late May with right rib pain, myalgias, and constipation. She had seen rheumatology for myalgias and workup negative per PCP notes. Ultrasound of the abdomen done for right rib sided pain, it was negative. Right rib films negative.   Cc: Particia Nearingachel [address]

## 2016-09-29 NOTE — Patient Instructions (Addendum)
  Please go to the basement level to have your labs drawn. Start the Prilosec, 30 min before breakfast the Primary Care prescribed.  Try this for 30 days .   Call us if symptoms worsen.  We made you a follow up with Dr. Amada JupiterHenry Danis for 11-16-2016 at 11:00 am.

## 2016-09-30 ENCOUNTER — Encounter: Payer: Self-pay | Admitting: Nurse Practitioner

## 2016-09-30 NOTE — Progress Notes (Signed)
Thank you for sending this case to me. I have reviewed the entire note, and the outlined plan seems appropriate.  Sounds more likely to be functional than biliary colic.

## 2016-11-16 ENCOUNTER — Other Ambulatory Visit (INDEPENDENT_AMBULATORY_CARE_PROVIDER_SITE_OTHER): Payer: Managed Care, Other (non HMO)

## 2016-11-16 ENCOUNTER — Encounter: Payer: Self-pay | Admitting: Gastroenterology

## 2016-11-16 ENCOUNTER — Ambulatory Visit (INDEPENDENT_AMBULATORY_CARE_PROVIDER_SITE_OTHER): Payer: Managed Care, Other (non HMO) | Admitting: Gastroenterology

## 2016-11-16 VITALS — BP 110/68 | HR 78 | Ht 62.5 in | Wt 142.0 lb

## 2016-11-16 DIAGNOSIS — R1011 Right upper quadrant pain: Secondary | ICD-10-CM | POA: Diagnosis not present

## 2016-11-16 LAB — H. PYLORI ANTIBODY, IGG: H Pylori IgG: NEGATIVE

## 2016-11-16 MED ORDER — HYOSCYAMINE SULFATE 0.125 MG SL SUBL: 0.1250 mg | SUBLINGUAL_TABLET | Freq: Four times a day (QID) | SUBLINGUAL | 1 refills | Status: DC | PRN

## 2016-11-16 NOTE — Patient Instructions (Signed)
If you are age 21 or older, your body mass index should be between 23-30. Your Body mass index is 25.56 kg/m. If this is out of the aforementioned range listed, please consider follow up with your Primary Care Provider.  If you are age 21 or younger, your body mass index should be between 19-25. Your Body mass index is 25.56 kg/m. If this is out of the aformentioned range listed, please consider follow up with your Primary Care Provider.   Your physician has requested that you go to the basement for the following lab work before leaving today: H.pylori  Thank you for choosing [facility]  Dr Amada JupiterHenry Danis III

## 2016-11-16 NOTE — Progress Notes (Signed)
[facility] Progress Note  Chief Complaint: Right upper quadrant/lower chest wall pain  Subjective  History:   Saw APP 10/5 - reviewed note [facility], after a 2 day episode of a fairly constant feeling of tightness or squeezing in the same area. She says another ultrasound was normal He is difficult to characterize, does not consistently happen after meals, with change in position or with bowel movements. She sometimes has bilateral pelvic pain before bowel movements. Reports it has gone on since a fall on the right side last year  ROS: Cardiovascular:  no chest pain Respiratory: no dyspnea  The patient's Past Medical, Family and Social History were reviewed and are on file in the EMR.  Objective:  Med list reviewed  Vital signs in last 24 hrs: Vitals:   11/16/16 1116  BP: 110/68  Pulse: 78    Physical Exam    HEENT: sclera anicteric, oral mucosa moist without lesions  Neck: supple, no thyromegaly, JVD or lymphadenopathy  Cardiac: RRR without murmurs, S1S2 heard, no peripheral edema  Pulm: clear to auscultation bilaterally, normal RR and effort noted  Abdomen: soft, No tenderness, with active bowel sounds. No guarding or palpable hepatosplenomegaly. No chest wall tenderness  Skin; warm and dry, no jaundice or rash     @ASSESSMENTPLANBEGIN @ Assessment: Encounter Diagnosis  Name Primary?  . RUQ pain Yes   Not clearly digestive in nature, it may still be musculoskeletal. If it is digestive, it sounds as if it is not likely to be more than intestinal spasm, especially with some pelvic pain around bowel movements. She does not have correlation with these symptoms and her menses to indicate endometriosis.  This is not biliary colic. Plan: Serum H. pylori antibody Trial of hyoscyamine.  I think an endoscopic workup would be a very low yield Return to primary care   Total time 25 minutes, over half spent in counseling and coordination of care.    Charlie PitterHenry L Danis III

## 2016-12-05 ENCOUNTER — Telehealth: Payer: Self-pay | Admitting: Unknown Physician Specialty

## 2016-12-05 DIAGNOSIS — Z0000 Encounter for general adult medical examination without abnormal findings: Secondary | ICD-10-CM

## 2016-12-05 NOTE — Telephone Encounter (Signed)
Routing to provider. Elnita MaxwellCheryl, can you please place future orders for a TB screening and drug screen?

## 2016-12-05 NOTE — Telephone Encounter (Signed)
Patient is scheduled for her last HPV on Wed @830  but she would also like to get a TB injection and have a 12 panel drug screening if these can be taken care of here at the same time.  Please advise..    Thank you  [phone]

## 2016-12-07 ENCOUNTER — Other Ambulatory Visit: Payer: Managed Care, Other (non HMO)

## 2016-12-07 ENCOUNTER — Ambulatory Visit (INDEPENDENT_AMBULATORY_CARE_PROVIDER_SITE_OTHER): Payer: Managed Care, Other (non HMO)

## 2016-12-07 DIAGNOSIS — Z23 Encounter for immunization: Secondary | ICD-10-CM | POA: Diagnosis not present

## 2016-12-07 DIAGNOSIS — Z0000 Encounter for general adult medical examination without abnormal findings: Secondary | ICD-10-CM

## 2016-12-08 LAB — URINE DRUGS OF ABUSE SCREEN W ALC, ROUTINE (REF LAB)
AMPHETAMINES, URINE: NEGATIVE ng/mL
Barbiturate Quant, Ur: NEGATIVE ng/mL
Benzodiazepine Quant, Ur: NEGATIVE ng/mL
CANNABINOID QUANT UR: NEGATIVE ng/mL
Cocaine (Metab.): NEGATIVE ng/mL
Ethanol U, Quan: NEGATIVE %
METHADONE SCREEN, URINE: NEGATIVE ng/mL
Opiate Quant, Ur: NEGATIVE ng/mL
PCP QUANT UR: NEGATIVE ng/mL
PROPOXYPHENE: NEGATIVE ng/mL

## 2016-12-09 LAB — QUANTIFERON IN TUBE
QFT TB AG MINUS NIL VALUE: 0 [IU]/mL
QUANTIFERON MITOGEN VALUE: 6.43 IU/mL
QUANTIFERON NIL VALUE: 0.03 [IU]/mL
QUANTIFERON TB AG VALUE: 0.03 IU/mL
QUANTIFERON TB GOLD: NEGATIVE

## 2016-12-09 LAB — QUANTIFERON TB GOLD ASSAY (BLOOD)

## 2016-12-14 ENCOUNTER — Telehealth: Payer: Self-pay | Admitting: Unknown Physician Specialty

## 2016-12-14 NOTE — Telephone Encounter (Signed)
Called patient. No answer, mailbox is full.  Labs and immunization record up front for patient to pick up.

## 2016-12-14 NOTE — Telephone Encounter (Signed)
Patient is requesting a copy of her flu shot injection record, and also her labs.  She would like to pick these up today if possible.  Thank You

## 2017-05-15 ENCOUNTER — Telehealth: Payer: Self-pay | Admitting: Unknown Physician Specialty

## 2017-05-15 NOTE — Telephone Encounter (Signed)
Reprinted and place in front desk file folder awaiting call back and/or pickup.

## 2017-05-15 NOTE — Telephone Encounter (Signed)
Attempted to reach pt. VM box full, unable to leave message. Per lab encounter when first drawn results were place in file folder at front desk for patient to pick up.

## 2017-05-15 NOTE — Telephone Encounter (Signed)
Patient came to pick up prescription around 11:30 am.

## 2017-05-15 NOTE — Telephone Encounter (Signed)
Patient called to see if there is a record of her TB test results on file. Patient would like a copy of her results. Patient would also like for someone to give her a call once we get the copy of her records.   Please Advise.  Thank you.

## 2017-09-11 IMAGING — DX DG RIBS 2V*R*
3 series · 3 of 3 positions shown · non-contrast
Comparison: No recent prior.

CLINICAL DATA: Injury.  Pain.

EXAM:
RIGHT RIBS - 2 VIEW

[rib pa]
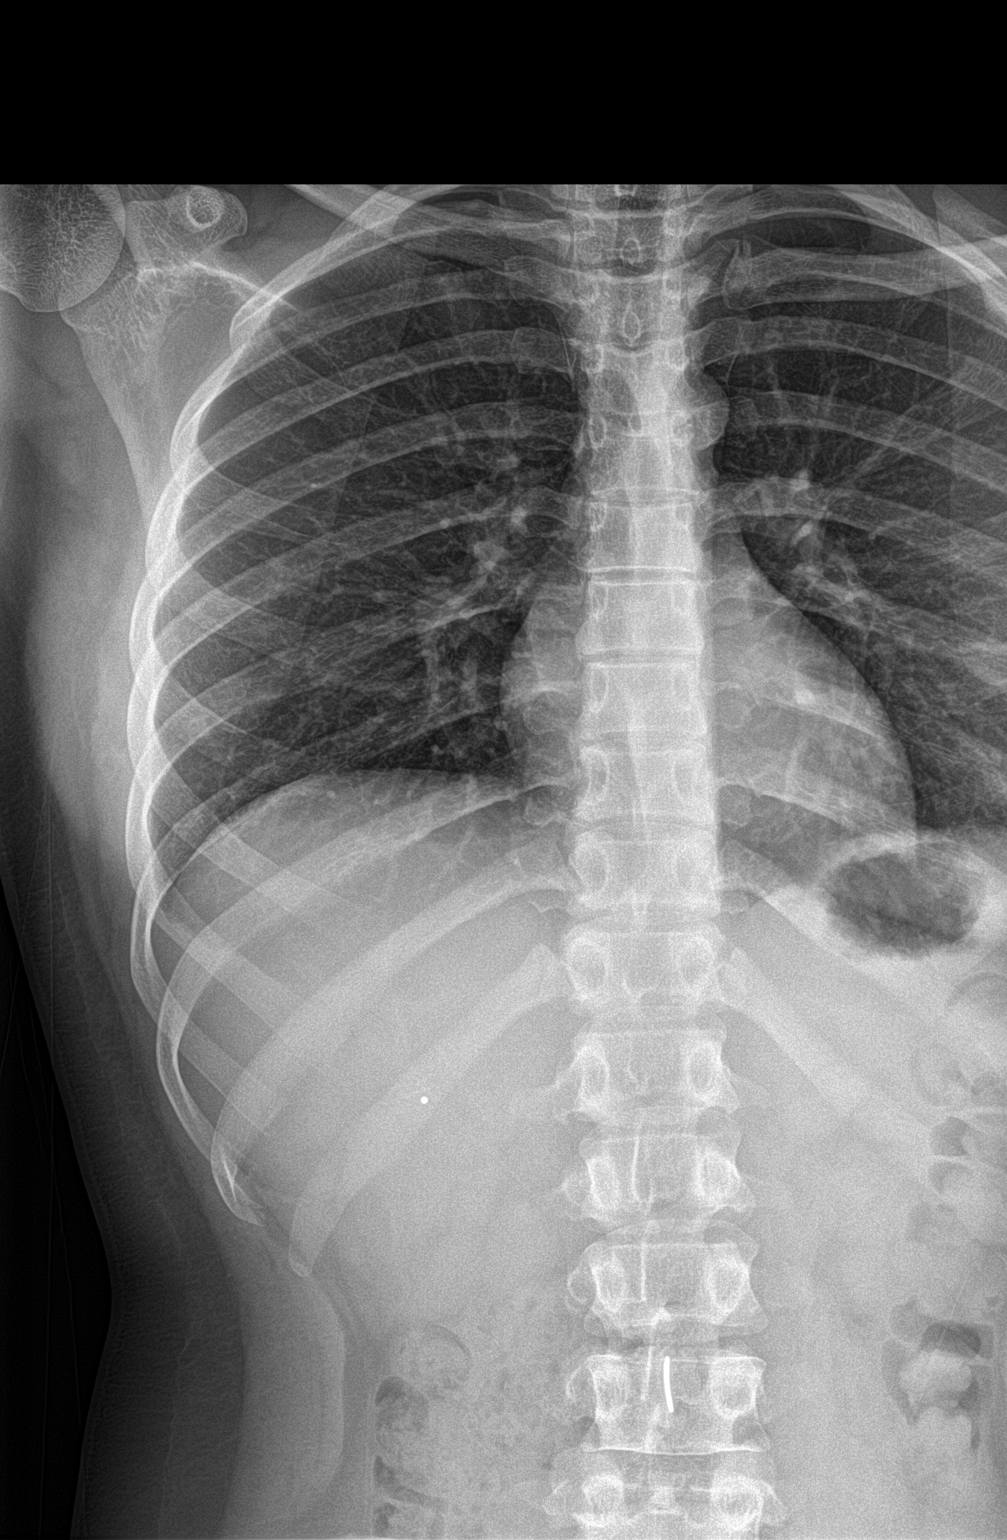

[rib obl]
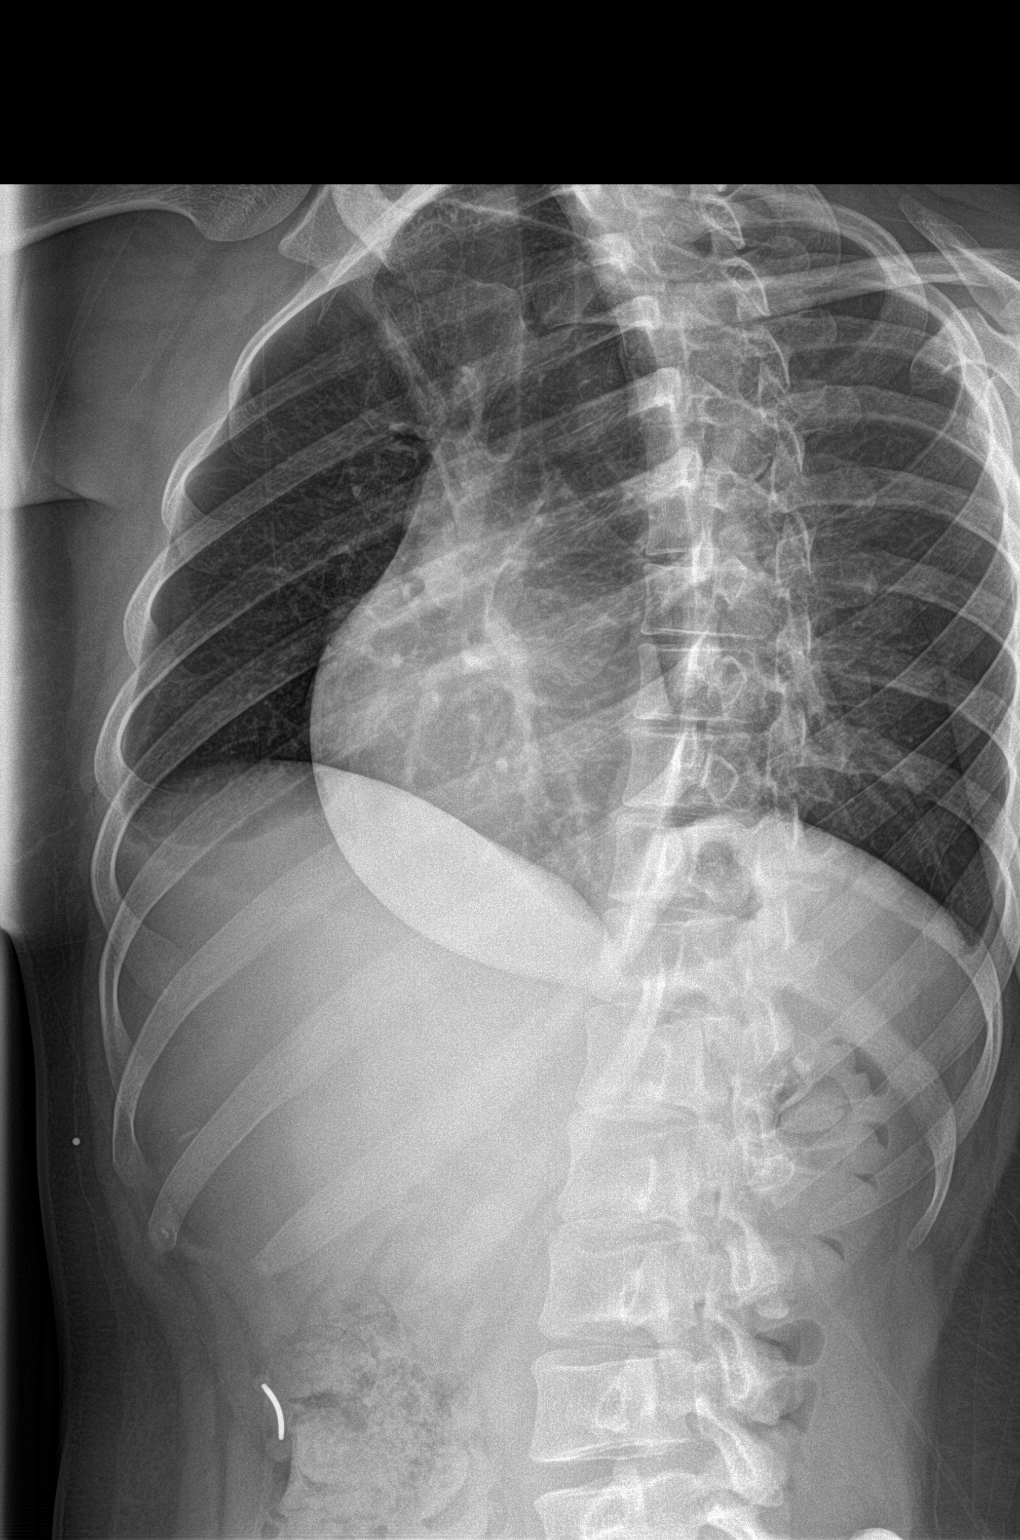

[chest pa]
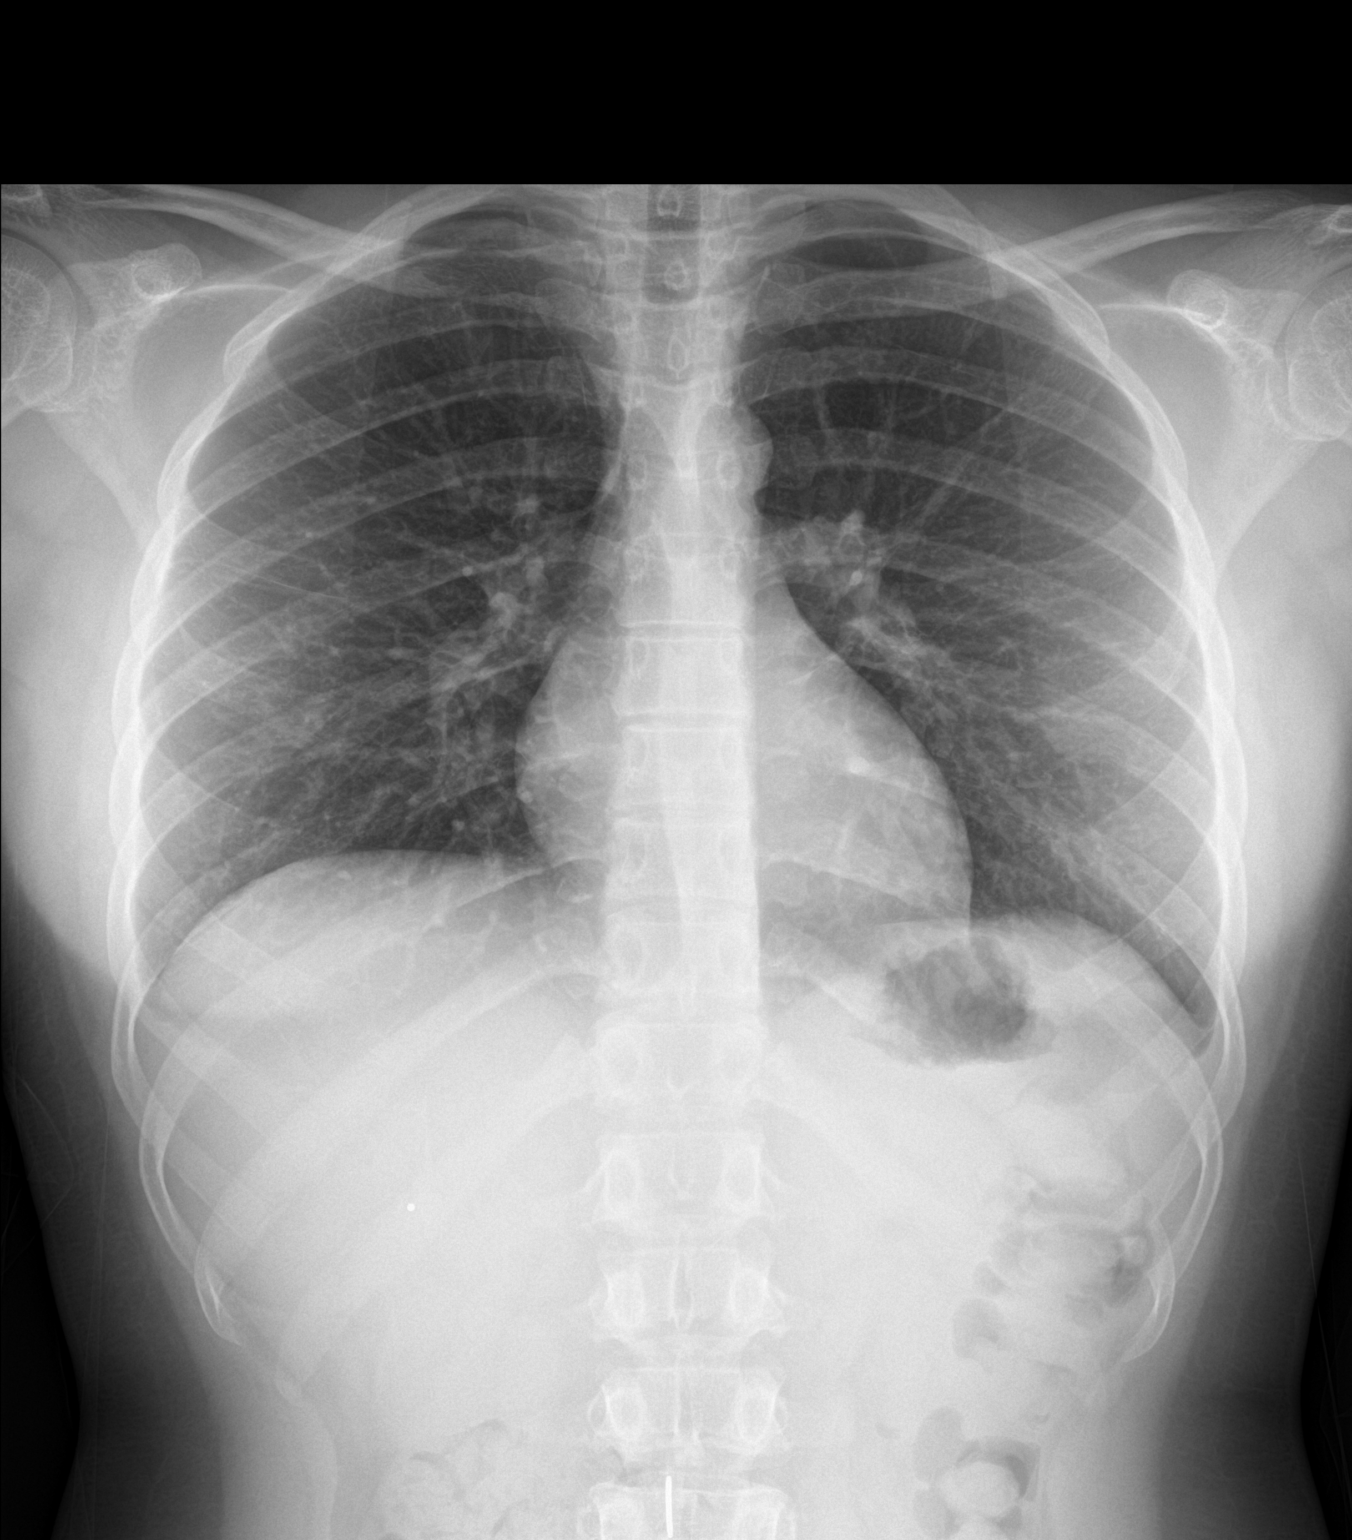

[3 of 3 positions shown; findings below may reference images not displayed]

FINDINGS: No evidence of displaced fracture or focal bony abnormality.
Metallic marker noted over the right chest. No pneumothorax.
IMPRESSION: No acute or focal abnormality.

## 2017-12-02 IMAGING — US US ABDOMEN COMPLETE
1 series · 14 of 25 positions shown · non-contrast
Comparison: No prior .

CLINICAL DATA: Abdominal pain.

EXAM:
ABDOMEN ULTRASOUND COMPLETE

[Series 1: us abdomen complete · 0.16mm/px · 14 of 126 slices shown]
[im 1/126]
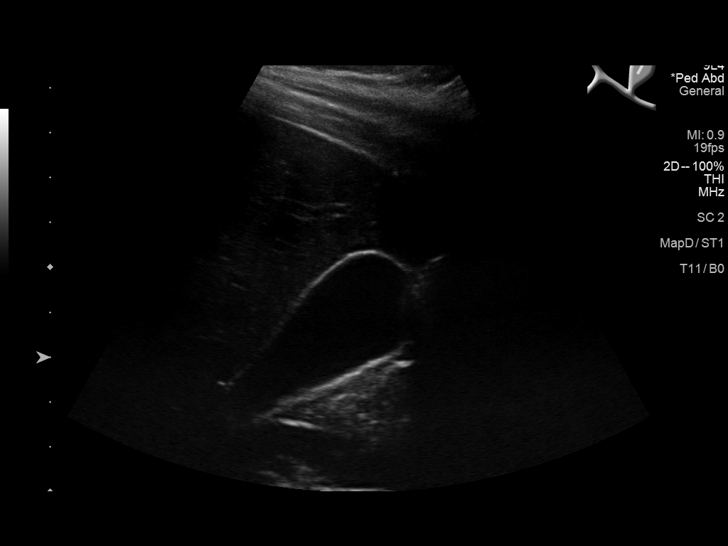
[im 11/126]
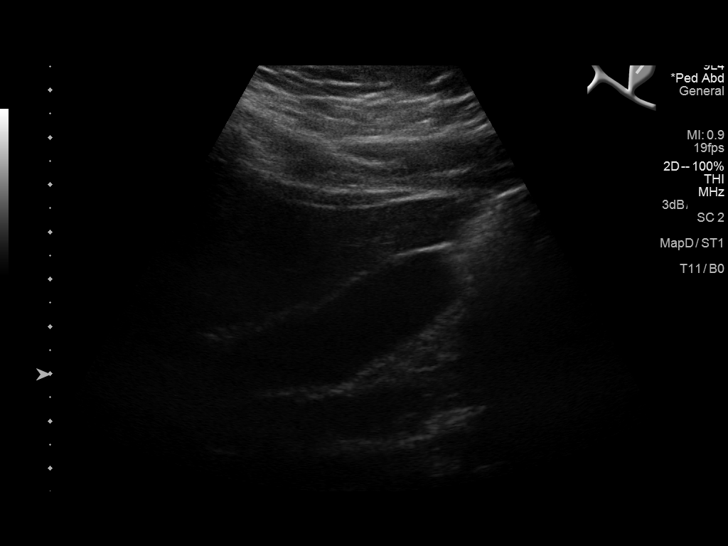
[im 21/126]
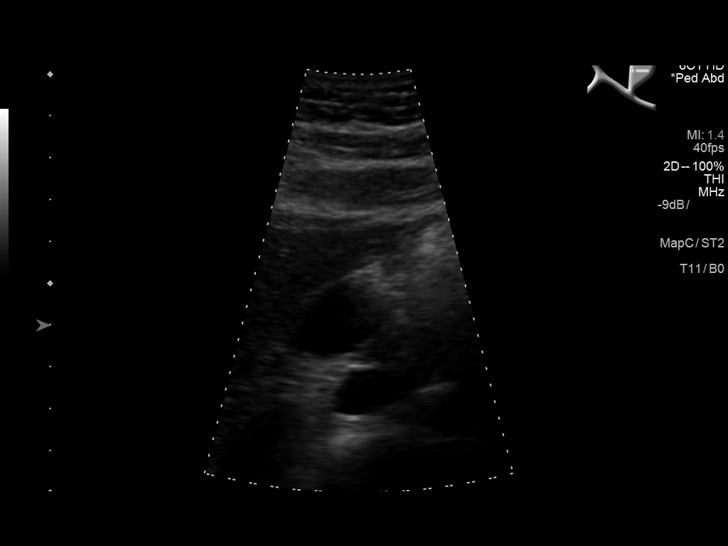
[im 32/126]
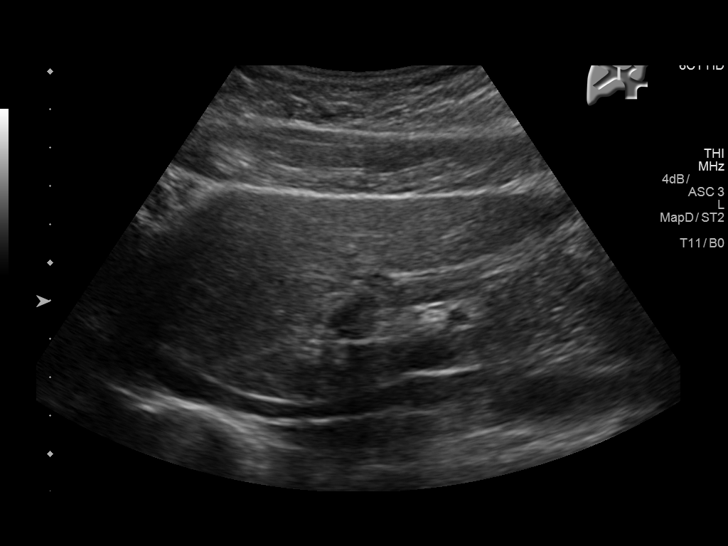
[im 42/126]
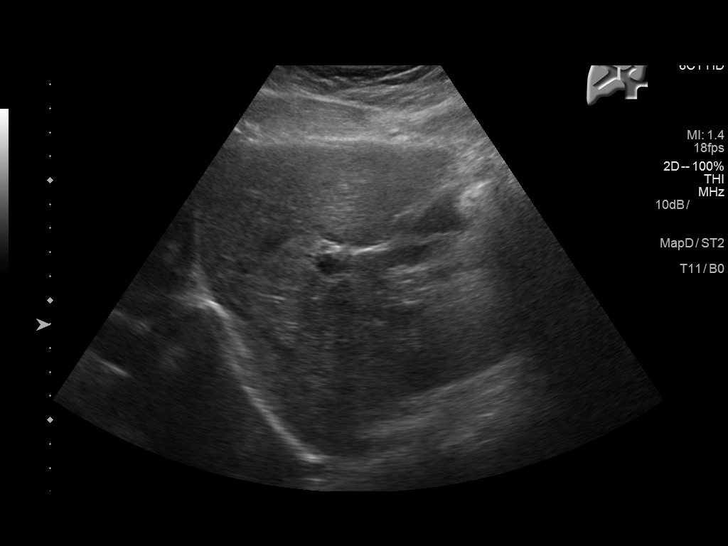
[im 47/126]
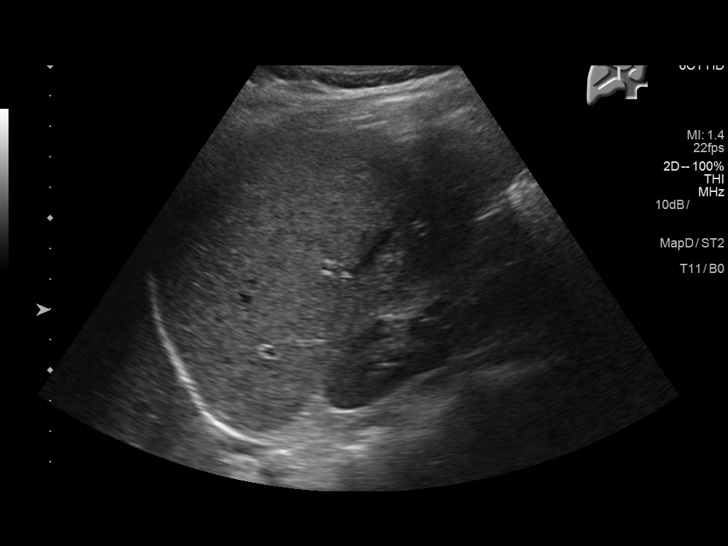
[im 58/126]
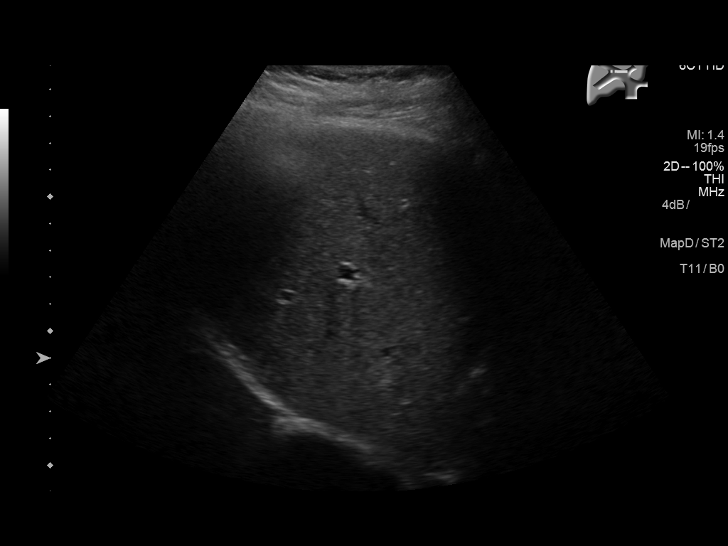
[im 68/126]
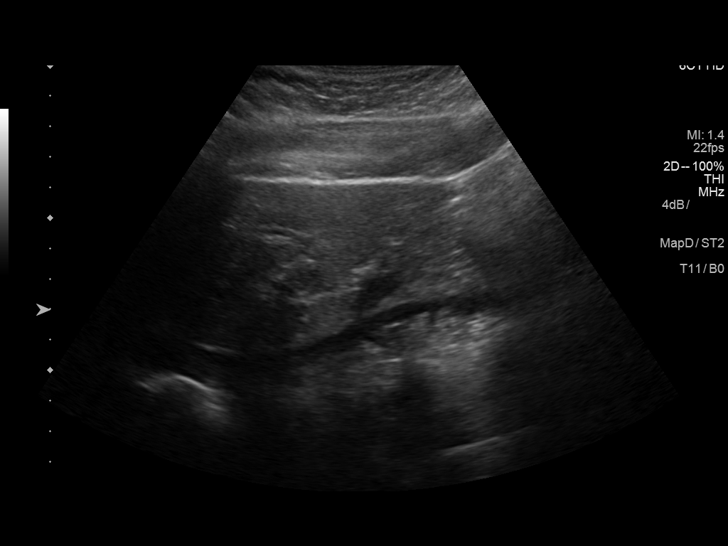
[im 79/126]
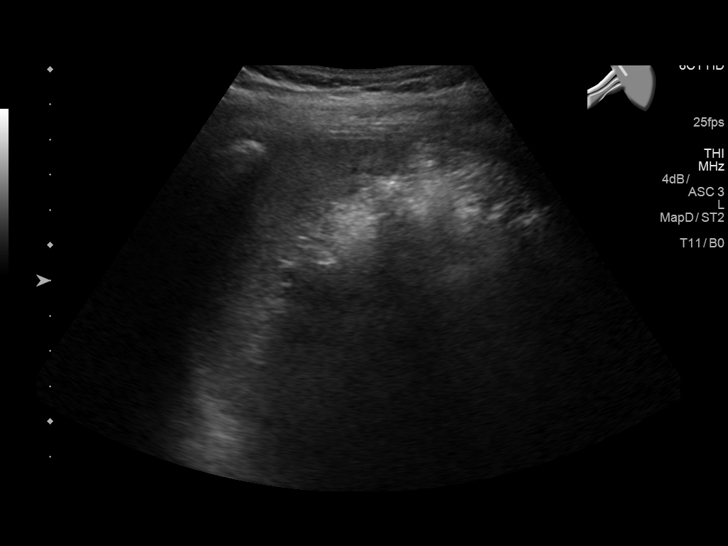
[im 84/126]
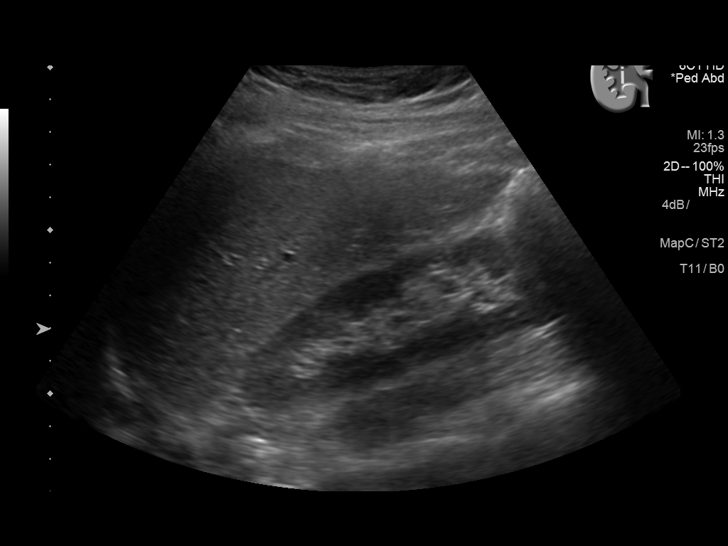
[im 94/126]
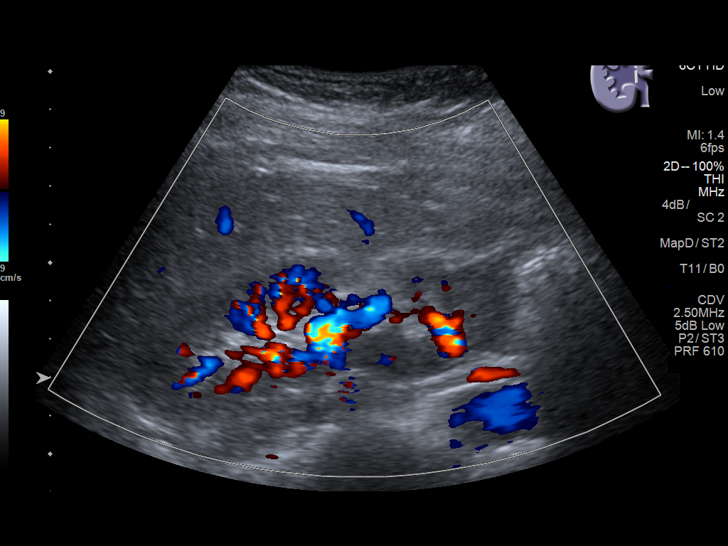
[im 105/126]
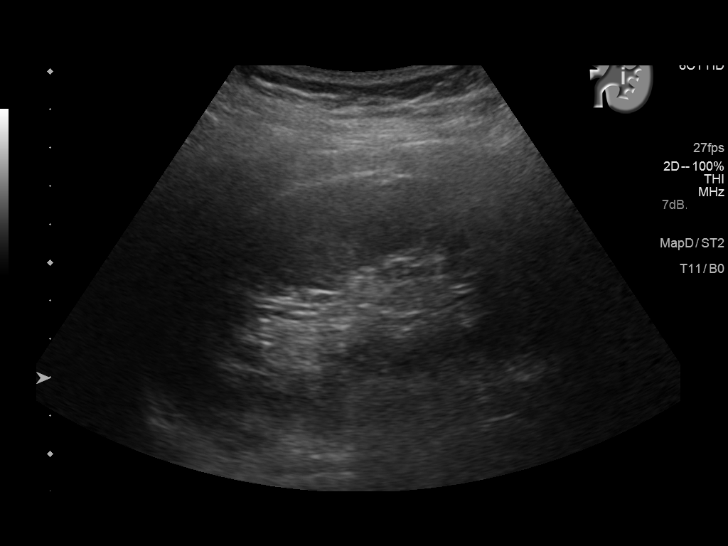
[im 115/126]
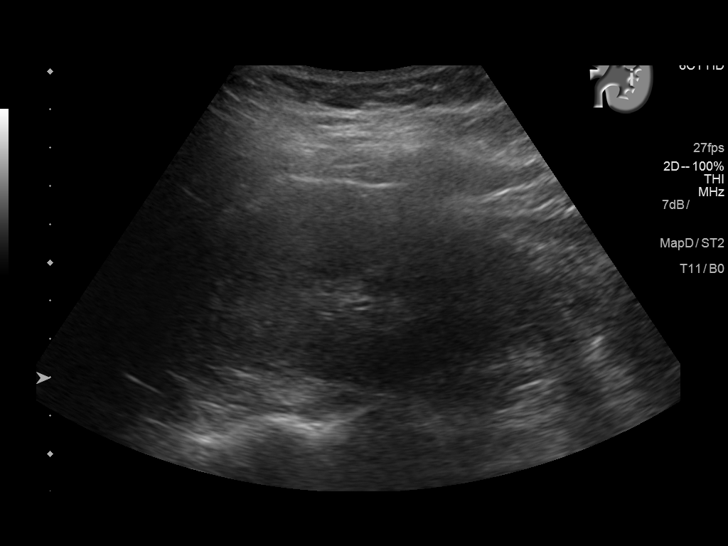
[im 126/126]
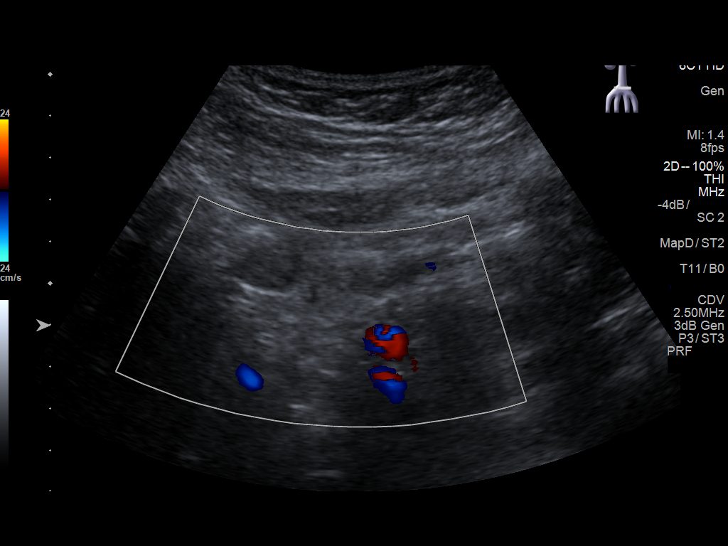

[14 of 25 positions shown; findings below may reference images not displayed]

FINDINGS: Gallbladder: No gallstones or wall thickening visualized. No
sonographic Murphy sign noted by sonographer.

Common bile duct: Diameter: 4.2 mm

Liver: No focal lesion identified. Within normal limits in
parenchymal echogenicity.

IVC: No abnormality visualized.

Pancreas: Visualized portion unremarkable.

Spleen: Size and appearance within normal limits.

Right Kidney: Length: 10.8 cm. Echogenicity within normal limits. No
mass or hydronephrosis visualized.

Left Kidney: Length: 11.3 cm. Echogenicity within normal limits. No
mass or hydronephrosis visualized.

Abdominal aorta: No aneurysm visualized.

Other findings: Exam was limited by a small rib interspaces for
scanning.
IMPRESSION: Negative exam.

## 2017-12-14 ENCOUNTER — Telehealth: Payer: Self-pay

## 2017-12-14 DIAGNOSIS — Z111 Encounter for screening for respiratory tuberculosis: Secondary | ICD-10-CM

## 2017-12-14 NOTE — Telephone Encounter (Signed)
Pt had last one done in Dec. '17. Please advise.   Copied from CRM [phone]. Topic: General - Other >> Dec 14, 2017 12:19 PM Debroah LoopLander, Lumin L wrote: Reason for CRM: Patient would like [facility] TB lab draw. She see's NP Wicker.

## 2017-12-15 ENCOUNTER — Telehealth: Payer: Self-pay | Admitting: Unknown Physician Specialty

## 2017-12-15 NOTE — Telephone Encounter (Signed)
ERROR

## 2017-12-15 NOTE — Telephone Encounter (Signed)
Left message for patient. Order placed. Pt may come by to have blood drawn.

## 2017-12-15 NOTE — Telephone Encounter (Signed)
Blank message. Was there supposed to be any information in there?

## 2017-12-20 NOTE — Telephone Encounter (Signed)
Pt would also like to have glucose test when she comes in for labs on 12/21/17.

## 2017-12-20 NOTE — Telephone Encounter (Signed)
Pt will have to have see provider for further labs as she has not been seen in greater than 1 year.

## 2017-12-21 ENCOUNTER — Other Ambulatory Visit: Payer: 59

## 2017-12-21 DIAGNOSIS — Z111 Encounter for screening for respiratory tuberculosis: Secondary | ICD-10-CM

## 2017-12-27 LAB — QUANTIFERON-TB GOLD PLUS

## 2017-12-27 LAB — SPECIMEN STATUS REPORT

## 2017-12-29 ENCOUNTER — Telehealth: Payer: Self-pay | Admitting: Unknown Physician Specialty

## 2017-12-29 NOTE — Telephone Encounter (Signed)
Copied from CRM [phone]. Topic: Quick Communication - See Telephone Encounter >> Dec 29, 2017  2:19 PM Eston Mouldavis, Rosmary Dionisio B wrote: CRM for notification. See Telephone encounter for:  Pt called for results of tb test   12/29/17.

## 2017-12-29 NOTE — Telephone Encounter (Signed)
Called and left patient a VM asking for her to please return my call.  

## 2017-12-29 NOTE — Telephone Encounter (Signed)
Looked in patient's chart and it says result was cancelled. Called back to the lab to find out why this was. Trey PaulaJeff with [facility] looked into it and said that the have recently switched to 4 tubs sets from 3 tube sets. Trey PaulaJeff states that they were told that the would still test the 3 tub sets but they are not. Patient would need to come back in to have the test re drawn in the correct tube set. Will call and let her know.

## 2018-01-01 ENCOUNTER — Other Ambulatory Visit: Payer: Self-pay

## 2018-01-01 DIAGNOSIS — Z111 Encounter for screening for respiratory tuberculosis: Secondary | ICD-10-CM

## 2018-01-01 NOTE — Telephone Encounter (Signed)
Called and left patient a VM asking for her to please return my call.  

## 2018-01-01 NOTE — Telephone Encounter (Signed)
Called and explained to patient what happened with the lab. Apologized to the patient as well. Patient states that she will probably stop by tomorrow to have the lab re-done. Will enter order.

## 2018-01-02 ENCOUNTER — Other Ambulatory Visit: Payer: 59

## 2018-01-02 DIAGNOSIS — Z111 Encounter for screening for respiratory tuberculosis: Secondary | ICD-10-CM

## 2018-01-03 LAB — QUANTIFERON-TB GOLD PLUS

## 2018-01-05 ENCOUNTER — Encounter: Payer: Self-pay | Admitting: Unknown Physician Specialty

## 2018-01-05 LAB — QUANTIFERON-TB GOLD PLUS
QUANTIFERON-TB GOLD PLUS: NEGATIVE
QuantiFERON Mitogen Value: >10 IU/mL
QuantiFERON Nil Value: 0.02 IU/mL
QuantiFERON TB1 Ag Value: 0.01 IU/mL
QuantiFERON TB2 Ag Value: 0.01 IU/mL

## 2018-05-23 ENCOUNTER — Encounter: Payer: Self-pay | Admitting: Family Medicine

## 2018-05-30 ENCOUNTER — Ambulatory Visit: Payer: 59 | Admitting: Unknown Physician Specialty

## 2018-11-01 ENCOUNTER — Encounter: Payer: Self-pay | Admitting: Family Medicine

## 2018-11-16 LAB — HM PAP SMEAR: HM Pap smear: NEGATIVE

## 2018-11-30 ENCOUNTER — Encounter: Payer: Self-pay | Admitting: Family Medicine

## 2018-11-30 ENCOUNTER — Ambulatory Visit: Payer: 59 | Admitting: Family Medicine

## 2018-11-30 VITALS — BP 120/84 | HR 103 | Temp 98.4°F | Ht 63.0 in | Wt 155.5 lb

## 2018-11-30 DIAGNOSIS — R202 Paresthesia of skin: Secondary | ICD-10-CM | POA: Diagnosis not present

## 2018-11-30 DIAGNOSIS — R7309 Other abnormal glucose: Secondary | ICD-10-CM

## 2018-11-30 DIAGNOSIS — L819 Disorder of pigmentation, unspecified: Secondary | ICD-10-CM

## 2018-11-30 NOTE — Progress Notes (Signed)
BP 120/84   Pulse (!) 103   Temp 98.4 F (36.9 C) (Oral)   Ht 5\' 3"  (1.6 m)   Wt 155 lb 8 oz (70.5 kg)   LMP 11/12/2018 (Approximate)   SpO2 98%   BMI 27.55 kg/m    Subjective:    Patient ID: Claudia Hanson, female    DOB: 19-Jun-1996, 22 y.o.   MRN: [id number]  HPI: Claudia Hanson is a 22 y.o. female  Chief Complaint  Patient presents with  . Tingling    pt states she has had noticed tingling in her hands and her feet off and on for the last few months  . Skin Discoloration    pt states she has noticed a dark ring around her neck since the summer    Here today with multiple concerns.   Concerned about blood sugars, states she's had a high reading or two in the past. Fhx of diabetes with both sides of grandparents. Now having tingling intermittently in extremities and worried about diabetes. No low blood sugar spells, polydipsia, polyphagia.   Dark circle around neck that seemed to be noticed initially about 6 months ago. Has not changed since noticed. Seems to only be on the back of her neck. Not itchy or painful. Has not been trying anything OTC for sxs.   Tingling in hands and occasionally feet, no real pattern to it. Pinky turned numb a few weeks ago but sxs resolved spontaneously after a day or so. 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 Not trying anything OTC for these sxs.  Past Medical History:  Diagnosis Date  . Gastritis   . Generalized headaches   . Vitamin D deficiency    Social History   Socioeconomic History  . Marital status: Single    Spouse name: Not on file  . Number of children: 0  . Years of education: Not on file  . Highest education level: Not on file  Occupational History  . Occupation: Consulting civil engineerstudent  Social Needs  . Financial resource strain: Not on file  . Food insecurity:    Worry: Not on file    Inability: Not on file  . Transportation needs:    Medical: Not on file    Non-medical: Not on  file  Tobacco Use  . Smoking status: Never Smoker  . Smokeless tobacco: Never Used  Substance and Sexual Activity  . Alcohol use: Yes    Alcohol/week: 0.0 standard drinks    Comment: social  . Drug use: No  . Sexual activity: Not on file  Lifestyle  . Physical activity:    Days per week: Not on file    Minutes per session: Not on file  . Stress: Not on file  Relationships  . Social connections:    Talks on phone: Not on file    Gets together: Not on file    Attends religious service: Not on file    Active member of club or organization: Not on file    Attends meetings of clubs or organizations: Not on file    Relationship status: Not on file  . Intimate partner violence:    Fear of current or ex partner: Not on file    Emotionally abused: Not on file    Physically abused: Not on file    Forced sexual activity: Not on file  Other Topics Concern  . Not on file  Social History Narrative  . Not on  file    Relevant past medical, surgical, family and social history reviewed and updated as indicated. Interim medical history since our last visit reviewed. Allergies and medications reviewed and updated.  Review of Systems  Per HPI unless specifically indicated above     Objective:    BP 120/84   Pulse (!) 103   Temp 98.4 F (36.9 C) (Oral)   Ht 5\' 3"  (1.6 m)   Wt 155 lb 8 oz (70.5 kg)   LMP 11/12/2018 (Approximate)   SpO2 98%   BMI 27.55 kg/m   Wt Readings from Last 3 Encounters:  11/30/18 155 lb 8 oz (70.5 kg)  11/16/16 142 lb (64.4 kg)  09/29/16 140 lb (63.5 kg)    Physical Exam  Constitutional: She is oriented to person, place, and time. She appears well-developed and well-nourished. No distress.  HENT:  Head: Atraumatic.  Eyes: Conjunctivae and EOM are normal.  Neck: Normal range of motion. Neck supple.  Cardiovascular: Normal rate, regular rhythm, normal heart sounds and intact distal pulses.  Pulmonary/Chest: Effort normal and breath sounds normal.    Musculoskeletal: Normal range of motion. She exhibits no edema or tenderness.  Neurological: She is alert and oriented to person, place, and time. No cranial nerve deficit or sensory deficit.  Skin: Skin is warm and dry.  Crease lines on posterior neck, minimal discoloration and no textural changes to skin  Psychiatric: She has a normal mood and affect. Her behavior is normal. Thought content normal.  Nursing note and vitals reviewed.   Results for orders placed or performed in visit on 11/30/18  HgB A1c  Result Value Ref Range   Hgb A1c MFr Bld 5.0 4.8 - 5.6 %   Est. average glucose Bld gHb Est-mCnc 97 mg/dL  Vitamin U98  Result Value Ref Range   Vitamin B-12 382 232 - 1,245 pg/mL  Vitamin D (25 hydroxy)  Result Value Ref Range   Vit D, 25-Hydroxy 18.7 (L) 30.0 - 100.0 ng/mL      Assessment & Plan:   Problem List Items Addressed This Visit    None    Visit Diagnoses    Abnormal blood sugar    -  Primary   check A1C for reassurance and monitoring given family history and past elevated glucose reading. Diet and exercise reviewed for prevention   Relevant Orders   HgB A1c (Completed)   Tingling in extremities       Check vit levels, change repetitive movements to reduce nerve irritation, stretches, anti inflammatories prn   Relevant Orders   Vitamin B12 (Completed)   Vitamin D (25 hydroxy) (Completed)   Discolored skin       Suspect what she's seeing is from crease line. No major discoloration and no textural change. Continue to monitor, reassurance given       Follow up plan: Return for CPE.

## 2018-12-01 LAB — VITAMIN B12: Vitamin B-12: 382 pg/mL (ref 232–1245)

## 2018-12-01 LAB — HEMOGLOBIN A1C
ESTIMATED AVERAGE GLUCOSE: 97 mg/dL
Hgb A1c MFr Bld: 5 % (ref 4.8–5.6)

## 2018-12-01 LAB — VITAMIN D 25 HYDROXY (VIT D DEFICIENCY, FRACTURES): Vit D, 25-Hydroxy: 18.7 ng/mL — ABNORMAL LOW (ref 30.0–100.0)

## 2019-04-21 ENCOUNTER — Encounter: Payer: Self-pay | Admitting: Family Medicine

## 2019-06-18 ENCOUNTER — Ambulatory Visit (INDEPENDENT_AMBULATORY_CARE_PROVIDER_SITE_OTHER): Payer: 59 | Admitting: Family Medicine

## 2019-06-18 ENCOUNTER — Other Ambulatory Visit: Payer: Self-pay

## 2019-06-18 ENCOUNTER — Encounter: Payer: Self-pay | Admitting: Family Medicine

## 2019-06-18 VITALS — BP 134/86 | HR 88 | Temp 98.2°F | Ht 62.0 in | Wt 157.4 lb

## 2019-06-18 DIAGNOSIS — E559 Vitamin D deficiency, unspecified: Secondary | ICD-10-CM

## 2019-06-18 DIAGNOSIS — Z111 Encounter for screening for respiratory tuberculosis: Secondary | ICD-10-CM

## 2019-06-18 DIAGNOSIS — Z0000 Encounter for general adult medical examination without abnormal findings: Secondary | ICD-10-CM

## 2019-06-18 NOTE — Assessment & Plan Note (Signed)
Rechecking levels today- await results.

## 2019-06-18 NOTE — Patient Instructions (Signed)

## 2019-06-18 NOTE — Progress Notes (Signed)
BP 134/86   Pulse 88   Temp 98.2 F (36.8 C) (Oral)   Ht 5\' 2"  (1.575 m)   Wt 157 lb 6.4 oz (71.4 kg)   SpO2 98%   BMI 28.79 kg/m    Subjective:    Patient ID: Claudia Hanson, female    DOB: 07-30-1996, 23 y.o.   MRN: [id number]  HPI: Claudia Hanson is a 23 y.o. female presenting on 06/18/2019 for comprehensive medical examination. Current medical complaints include: Needs forms filled out for school. Doing well.   Depression Screen done today and results listed below:  Depression screen [facility] 2/9 06/18/2019 11/30/2018 02/12/2016  Decreased Interest 0 0 0  Down, Depressed, Hopeless 0 0 1  PHQ - 2 Score 0 0 1  Altered sleeping - 0 0  Tired, decreased energy - 1 0  Change in appetite - 0 2  Feeling bad or failure about yourself  - 0 1  Trouble concentrating - 0 2  Moving slowly or fidgety/restless - 0 0  Suicidal thoughts - 0 0  PHQ-9 Score - 1 6  Difficult doing work/chores - Not difficult at all -    Past Medical History:  Past Medical History:  Diagnosis Date  . Gastritis   . Generalized headaches   . Vitamin D deficiency     Surgical History:  Past Surgical History:  Procedure Laterality Date  . WISDOM TOOTH EXTRACTION      Medications:  Current Outpatient Medications on File Prior to Visit  Medication Sig  . JUNEL FE 1.5/30 1.5-30 MG-MCG tablet Take 1 tablet by mouth daily.   No current facility-administered medications on file prior to visit.     Allergies:  No Known Allergies  Social History:  Social History   Socioeconomic History  . Marital status: Single    Spouse name: Not on file  . Number of children: 0  . Years of education: Not on file  . Highest education level: Not on file  Occupational History  . Occupation: Consulting civil engineerstudent  Social Needs  . Financial resource strain: Not on file  . Food insecurity    Worry: Not on file    Inability: Not on file  . Transportation needs    Medical: Not on file    Non-medical: Not on file  Tobacco Use  .  Smoking status: Never Smoker  . Smokeless tobacco: Never Used  Substance and Sexual Activity  . Alcohol use: Yes    Alcohol/week: 0.0 standard drinks    Comment: social  . Drug use: No  . Sexual activity: Not on file  Lifestyle  . Physical activity    Days per week: Not on file    Minutes per session: Not on file  . Stress: Not on file  Relationships  . Social Musicianconnections    Talks on phone: Not on file    Gets together: Not on file    Attends religious service: Not on file    Active member of club or organization: Not on file    Attends meetings of clubs or organizations: Not on file    Relationship status: Not on file  . Intimate partner violence    Fear of current or ex partner: Not on file    Emotionally abused: Not on file    Physically abused: Not on file    Forced sexual activity: Not on file  Other Topics Concern  . Not on file  Social History Narrative  . Not on file  Social History   Tobacco Use  Smoking Status Never Smoker  Smokeless Tobacco Never Used   Social History   Substance and Sexual Activity  Alcohol Use Yes  . Alcohol/week: 0.0 standard drinks   Comment: social    Family History:  Family History  Problem Relation Age of Onset  . Diabetes Paternal Grandmother   . Heart disease Paternal Grandfather        MI  . Colon cancer Neg Hx     Past medical history, surgical history, medications, allergies, family history and social history reviewed with patient today and changes made to appropriate areas of the chart.   Review of Systems  Constitutional: Negative.   HENT: Negative.   Eyes: Positive for blurred vision (follows with eye doctor). Negative for double vision, photophobia, pain, discharge and redness.  Respiratory: Negative.   Cardiovascular: Positive for palpitations. Negative for chest pain, orthopnea, claudication, leg swelling and PND.  Gastrointestinal: Negative.   Genitourinary: Negative.   Musculoskeletal: Negative.   Skin:  Negative.   Neurological: Positive for tingling (in her fingers and feet- goes away with shaking). Negative for dizziness, tremors, sensory change, speech change, focal weakness, seizures, loss of consciousness, weakness and headaches.  Endo/Heme/Allergies: Negative.   Psychiatric/Behavioral: Negative.     All other ROS negative except what is listed above and in the HPI.      Objective:    BP 134/86   Pulse 88   Temp 98.2 F (36.8 C) (Oral)   Ht 5\' 2"  (1.575 m)   Wt 157 lb 6.4 oz (71.4 kg)   SpO2 98%   BMI 28.79 kg/m   Wt Readings from Last 3 Encounters:  06/18/19 157 lb 6.4 oz (71.4 kg)  11/30/18 155 lb 8 oz (70.5 kg)  11/16/16 142 lb (64.4 kg)    Physical Exam Vitals signs and nursing note reviewed.  Constitutional:      General: She is not in acute distress.    Appearance: Normal appearance. She is not ill-appearing, toxic-appearing or diaphoretic.  HENT:     Head: Normocephalic and atraumatic.     Right Ear: Tympanic membrane, ear canal and external ear normal. There is no impacted cerumen.     Left Ear: Tympanic membrane, ear canal and external ear normal. There is no impacted cerumen.     Nose: Nose normal. No congestion or rhinorrhea.     Mouth/Throat:     Mouth: Mucous membranes are moist.     Pharynx: Oropharynx is clear. No oropharyngeal exudate or posterior oropharyngeal erythema.  Eyes:     General: No scleral icterus.       Right eye: No discharge.        Left eye: No discharge.     Extraocular Movements: Extraocular movements intact.     Conjunctiva/sclera: Conjunctivae normal.     Pupils: Pupils are equal, round, and reactive to light.  Neck:     Musculoskeletal: Normal range of motion and neck supple. No neck rigidity or muscular tenderness.     Vascular: No carotid bruit.  Cardiovascular:     Rate and Rhythm: Normal rate and regular rhythm.     Pulses: Normal pulses.     Heart sounds: No murmur. No friction rub. No gallop.   Pulmonary:      Effort: Pulmonary effort is normal. No respiratory distress.     Breath sounds: Normal breath sounds. No stridor. No wheezing, rhonchi or rales.  Chest:     Chest wall: No tenderness.  Abdominal:     General: Abdomen is flat. Bowel sounds are normal. There is no distension.     Palpations: Abdomen is soft. There is no mass.     Tenderness: There is no abdominal tenderness. There is no right CVA tenderness, left CVA tenderness, guarding or rebound.     Hernia: No hernia is present.  Genitourinary:    Comments: Breast and pelvic exams deferred with shared decision making Musculoskeletal:        General: No swelling, tenderness, deformity or signs of injury.     Right lower leg: No edema.     Left lower leg: No edema.  Lymphadenopathy:     Cervical: No cervical adenopathy.  Skin:    General: Skin is warm and dry.     Capillary Refill: Capillary refill takes less than 2 seconds.     Coloration: Skin is not jaundiced or pale.     Findings: No bruising, erythema, lesion or rash.  Neurological:     General: No focal deficit present.     Mental Status: She is alert and oriented to person, place, and time. Mental status is at baseline.     Cranial Nerves: No cranial nerve deficit.     Sensory: No sensory deficit.     Motor: No weakness.     Coordination: Coordination normal.     Gait: Gait normal.     Deep Tendon Reflexes: Reflexes normal.  Psychiatric:        Mood and Affect: Mood normal.        Behavior: Behavior normal.        Thought Content: Thought content normal.        Judgment: Judgment normal.     Results for orders placed or performed in visit on 11/30/18  HgB A1c  Result Value Ref Range   Hgb A1c MFr Bld 5.0 4.8 - 5.6 %   Est. average glucose Bld gHb Est-mCnc 97 mg/dL  Vitamin B12  Result Value Ref Range   Vitamin B-12 382 232 - 1,245 pg/mL  Vitamin D (25 hydroxy)  Result Value Ref Range   Vit D, 25-Hydroxy 18.7 (L) 30.0 - 100.0 ng/mL      Assessment & Plan:    Problem List Items Addressed This Visit      Other   Vitamin D deficiency    Rechecking levels today- await results.       Relevant Orders   VITAMIN D 25 Hydroxy (Vit-D Deficiency, Fractures)    Other Visit Diagnoses    Routine general medical examination at a health care facility    -  Primary   Vaccines up to date. Screening labs checked today. Pap through GYN. Continue diet and exercise. Call with any concerns.    Relevant Orders   CBC with Differential/Platelet   Comprehensive metabolic panel   Lipid Panel w/o Chol/HDL Ratio   TSH   UA/M w/rflx Culture, Routine   Screening for tuberculosis       Labs drawn today. Await results.    Relevant Orders   QuantiFERON-TB Gold Plus       Follow up plan: Return in about 1 year (around 06/17/2020) for Physical.   LABORATORY TESTING:  - Pap smear: up to date- done at GYN  IMMUNIZATIONS:   - Tdap: Tetanus vaccination status reviewed: last tetanus booster within 10 years. - Influenza: Postponed to flu season - Pneumovax: Not applicable - Prevnar: Not applicable - HPV: Up to date  PATIENT COUNSELING:  Advised to take 1 mg of folate supplement per day if capable of pregnancy.   Sexuality: Discussed sexually transmitted diseases, partner selection, use of condoms, avoidance of unintended pregnancy  and contraceptive alternatives.   Advised to avoid cigarette smoking.  I discussed with the patient that most people either abstain from alcohol or drink within safe limits (<=14/week and <=4 drinks/occasion for males, <=7/weeks and <= 3 drinks/occasion for females) and that the risk for alcohol disorders and other health effects rises proportionally with the number of drinks per week and how often a drinker exceeds daily limits.  Discussed cessation/primary prevention of drug use and availability of treatment for abuse.   Diet: Encouraged to adjust caloric intake to maintain  or achieve ideal body weight, to reduce intake of dietary  saturated fat and total fat, to limit sodium intake by avoiding high sodium foods and not adding table salt, and to maintain adequate dietary potassium and calcium preferably from fresh fruits, vegetables, and low-fat dairy products.    stressed the importance of regular exercise  Injury prevention: Discussed safety belts, safety helmets, smoke detector, smoking near bedding or upholstery.   Dental health: Discussed importance of regular tooth brushing, flossing, and dental visits.    NEXT PREVENTATIVE PHYSICAL DUE IN 1 YEAR. Return in about 1 year (around 06/17/2020) for Physical.

## 2019-06-20 LAB — CBC WITH DIFFERENTIAL/PLATELET
Basophils Absolute: 0 10*3/uL (ref 0.0–0.2)
Basos: 0 %
EOS (ABSOLUTE): 0.2 10*3/uL (ref 0.0–0.4)
Eos: 1 %
Hematocrit: 40.2 % (ref 34.0–46.6)
Hemoglobin: 13.4 g/dL (ref 11.1–15.9)
Immature Grans (Abs): 0 10*3/uL (ref 0.0–0.1)
Immature Granulocytes: 0 %
Lymphocytes Absolute: 2.7 10*3/uL (ref 0.7–3.1)
Lymphs: 24 %
MCH: 30.7 pg (ref 26.6–33.0)
MCHC: 33.3 g/dL (ref 31.5–35.7)
MCV: 92 fL (ref 79–97)
Monocytes Absolute: 0.7 10*3/uL (ref 0.1–0.9)
Monocytes: 6 %
Neutrophils Absolute: 7.7 10*3/uL — ABNORMAL HIGH (ref 1.4–7.0)
Neutrophils: 69 %
Platelets: 323 10*3/uL (ref 150–450)
RBC: 4.36 x10E6/uL (ref 3.77–5.28)
RDW: 12 % (ref 11.7–15.4)
WBC: 11.2 10*3/uL — ABNORMAL HIGH (ref 3.4–10.8)

## 2019-06-20 LAB — QUANTIFERON-TB GOLD PLUS
QuantiFERON Mitogen Value: >10 IU/mL
QuantiFERON Nil Value: 0.02 IU/mL
QuantiFERON TB1 Ag Value: 0.01 IU/mL
QuantiFERON TB2 Ag Value: 0.02 IU/mL
QuantiFERON-TB Gold Plus: NEGATIVE

## 2019-06-20 LAB — COMPREHENSIVE METABOLIC PANEL
ALT: 19 IU/L (ref 0–32)
AST: 20 IU/L (ref 0–40)
Albumin/Globulin Ratio: 1.7 (ref 1.2–2.2)
Albumin: 4.3 g/dL (ref 3.9–5.0)
Alkaline Phosphatase: 45 IU/L (ref 39–117)
BUN/Creatinine Ratio: 13 (ref 9–23)
BUN: 9 mg/dL (ref 6–20)
Bilirubin Total: 0.3 mg/dL (ref 0.0–1.2)
CO2: 21 mmol/L (ref 20–29)
Calcium: 9.4 mg/dL (ref 8.7–10.2)
Chloride: 102 mmol/L (ref 96–106)
Creatinine, Ser: 0.68 mg/dL (ref 0.57–1.00)
GFR calc Af Amer: 142 mL/min/{1.73_m2} (ref >59)
GFR calc non Af Amer: 123 mL/min/{1.73_m2} (ref >59)
Globulin, Total: 2.6 g/dL (ref 1.5–4.5)
Glucose: 79 mg/dL (ref 65–99)
Potassium: 4.4 mmol/L (ref 3.5–5.2)
Sodium: 136 mmol/L (ref 134–144)
Total Protein: 6.9 g/dL (ref 6.0–8.5)

## 2019-06-20 LAB — LIPID PANEL W/O CHOL/HDL RATIO
Cholesterol, Total: 150 mg/dL (ref 100–199)
HDL: 30 mg/dL — ABNORMAL LOW (ref >39)
LDL Calculated: 92 mg/dL (ref 0–99)
Triglycerides: 139 mg/dL (ref 0–149)
VLDL Cholesterol Cal: 28 mg/dL (ref 5–40)

## 2019-06-20 LAB — TSH: TSH: 1.12 u[IU]/mL (ref 0.450–4.500)

## 2019-06-20 LAB — VITAMIN D 25 HYDROXY (VIT D DEFICIENCY, FRACTURES): Vit D, 25-Hydroxy: 23.3 ng/mL — ABNORMAL LOW (ref 30.0–100.0)

## 2019-06-21 ENCOUNTER — Encounter: Payer: Self-pay | Admitting: Family Medicine

## 2019-06-21 ENCOUNTER — Telehealth: Payer: Self-pay

## 2019-06-21 NOTE — Telephone Encounter (Signed)
Called patient. No answer. Left VM letting her know that the Immunizations form is ready for pick up.

## 2019-11-06 ENCOUNTER — Encounter: Payer: Self-pay | Admitting: Family Medicine

## 2020-06-22 ENCOUNTER — Other Ambulatory Visit: Payer: Self-pay

## 2020-06-22 ENCOUNTER — Encounter: Payer: Self-pay | Admitting: Family Medicine

## 2020-06-22 ENCOUNTER — Ambulatory Visit (INDEPENDENT_AMBULATORY_CARE_PROVIDER_SITE_OTHER): Payer: 59 | Admitting: Family Medicine

## 2020-06-22 VITALS — BP 113/76 | HR 91 | Temp 98.6°F | Ht 62.0 in | Wt 145.0 lb

## 2020-06-22 DIAGNOSIS — Z1159 Encounter for screening for other viral diseases: Secondary | ICD-10-CM

## 2020-06-22 DIAGNOSIS — Z0000 Encounter for general adult medical examination without abnormal findings: Secondary | ICD-10-CM

## 2020-06-22 DIAGNOSIS — E559 Vitamin D deficiency, unspecified: Secondary | ICD-10-CM

## 2020-06-22 DIAGNOSIS — Z021 Encounter for pre-employment examination: Secondary | ICD-10-CM | POA: Diagnosis not present

## 2020-06-22 LAB — URINALYSIS, ROUTINE W REFLEX MICROSCOPIC
Bilirubin, UA: NEGATIVE
Ketones, UA: NEGATIVE
Nitrite, UA: NEGATIVE
Protein,UA: NEGATIVE
Specific Gravity, UA: 1.025 (ref 1.005–1.030)
Urobilinogen, Ur: 0.2 mg/dL (ref 0.2–1.0)
pH, UA: 6 (ref 5.0–7.5)

## 2020-06-22 LAB — MICROSCOPIC EXAMINATION

## 2020-06-22 NOTE — Assessment & Plan Note (Signed)
Labs drawn today. Await results.  

## 2020-06-22 NOTE — Progress Notes (Signed)
BP 113/76   Pulse 91   Temp 98.6 F (37 C) (Oral)   Ht 5\' 2"  (1.575 m)   Wt 145 lb (65.8 kg)   SpO2 100%   BMI 26.52 kg/m    Subjective:    Patient ID: Claudia Hanson, female    DOB: 1996/08/06, 24 y.o.   MRN: [id number]  HPI: Claudia Hanson is a 24 y.o. female presenting on 06/22/2020 for comprehensive medical examination. Current medical complaints include:  Needs a form filled out for school.  Menopausal Symptoms: no  Depression Screen done today and results listed below:  [facility] 06/22/2020 06/18/2019 11/30/2018 02/12/2016  Decreased Interest 1 0 0 0  Down, Depressed, Hopeless 1 0 0 1  PHQ - 2 Score 2 0 0 1  Altered sleeping 0 - 0 0  Tired, decreased energy 1 - 1 0  Change in appetite 1 - 0 2  Feeling bad or failure about yourself  0 - 0 1  Trouble concentrating 0 - 0 2  Moving slowly or fidgety/restless 0 - 0 0  Suicidal thoughts 0 - 0 0  PHQ-9 Score 4 - 1 6  Difficult doing work/chores Not difficult at all - Not difficult at all -    Past Medical History:  Past Medical History:  Diagnosis Date  . Gastritis   . Generalized headaches   . Vitamin D deficiency     Surgical History:  Past Surgical History:  Procedure Laterality Date  . WISDOM TOOTH EXTRACTION      Medications:  Current Outpatient Medications on File Prior to Visit  Medication Sig  . JUNEL FE 1.5/30 1.5-30 MG-MCG tablet Take 1 tablet by mouth daily.   No current facility-administered medications on file prior to visit.    Allergies:  No Known Allergies  Social History:  Social History   Socioeconomic History  . Marital status: Single    Spouse name: Not on file  . Number of children: 0  . Years of education: Not on file  . Highest education level: Not on file  Occupational History  . Occupation: Consulting civil engineerstudent  Tobacco Use  . Smoking status: Never Smoker  . Smokeless tobacco: Never Used  Substance and Sexual Activity  . Alcohol use: Yes    Alcohol/week: 0.0 standard  drinks    Comment: social  . Drug use: No  . Sexual activity: Not on file  Other Topics Concern  . Not on file  Social History Narrative  . Not on file   Social Determinants of Health   Financial Resource Strain:   . Difficulty of Paying Living Expenses:   Food Insecurity:   . Worried About Programme researcher, broadcasting/film/videounning Out of Food in the Last Year:   . Baristaan Out of Food in the Last Year:   Transportation Needs:   . Freight forwarderLack of Transportation (Medical):   Marland Kitchen. Lack of Transportation (Non-Medical):   Physical Activity:   . Days of Exercise per Week:   . Minutes of Exercise per Session:   Stress:   . Feeling of Stress :   Social Connections:   . Frequency of Communication with Friends and Family:   . Frequency of Social Gatherings with Friends and Family:   . Attends Religious Services:   . Active Member of Clubs or Organizations:   . Attends BankerClub or Organization Meetings:   Marland Kitchen. Marital Status:   Intimate Partner Violence:   . Fear of Current or Ex-Partner:   . Emotionally Abused:   .  Physically Abused:   . Sexually Abused:    Social History   Tobacco Use  Smoking Status Never Smoker  Smokeless Tobacco Never Used   Social History   Substance and Sexual Activity  Alcohol Use Yes  . Alcohol/week: 0.0 standard drinks   Comment: social    Family History:  Family History  Problem Relation Age of Onset  . Diabetes Paternal Grandmother   . Heart disease Paternal Grandfather        MI  . Colon cancer Neg Hx     Past medical history, surgical history, medications, allergies, family history and social history reviewed with patient today and changes made to appropriate areas of the chart.   Review of Systems  Constitutional: Negative.   HENT: Negative.   Eyes: Negative.   Respiratory: Negative.   Cardiovascular: Positive for palpitations. Negative for chest pain, orthopnea, claudication, leg swelling and PND.  Gastrointestinal: Negative.   Genitourinary: Negative.   Musculoskeletal: Negative.    Skin: Negative.   Neurological: Positive for tingling. Negative for dizziness, tremors, sensory change, speech change, focal weakness, seizures, loss of consciousness, weakness and headaches.  Endo/Heme/Allergies: Negative.   Psychiatric/Behavioral: Negative.     All other ROS negative except what is listed above and in the HPI.      Objective:    BP 113/76   Pulse 91   Temp 98.6 F (37 C) (Oral)   Ht 5\' 2"  (1.575 m)   Wt 145 lb (65.8 kg)   SpO2 100%   BMI 26.52 kg/m   Wt Readings from Last 3 Encounters:  06/22/20 145 lb (65.8 kg)  06/18/19 157 lb 6.4 oz (71.4 kg)  11/30/18 155 lb 8 oz (70.5 kg)    Physical Exam Vitals and nursing note reviewed.  Constitutional:      General: She is not in acute distress.    Appearance: Normal appearance. She is not ill-appearing, toxic-appearing or diaphoretic.  HENT:     Head: Normocephalic and atraumatic.     Right Ear: Tympanic membrane, ear canal and external ear normal. There is no impacted cerumen.     Left Ear: Tympanic membrane, ear canal and external ear normal. There is no impacted cerumen.     Nose: Nose normal. No congestion or rhinorrhea.     Mouth/Throat:     Mouth: Mucous membranes are moist.     Pharynx: Oropharynx is clear. No oropharyngeal exudate or posterior oropharyngeal erythema.  Eyes:     General: No scleral icterus.       Right eye: No discharge.        Left eye: No discharge.     Extraocular Movements: Extraocular movements intact.     Conjunctiva/sclera: Conjunctivae normal.     Pupils: Pupils are equal, round, and reactive to light.  Neck:     Vascular: No carotid bruit.  Cardiovascular:     Rate and Rhythm: Normal rate and regular rhythm.     Pulses: Normal pulses.     Heart sounds: No murmur heard.  No friction rub. No gallop.   Pulmonary:     Effort: Pulmonary effort is normal. No respiratory distress.     Breath sounds: Normal breath sounds. No stridor. No wheezing, rhonchi or rales.  Chest:      Chest wall: No tenderness.  Abdominal:     General: Abdomen is flat. Bowel sounds are normal. There is no distension.     Palpations: Abdomen is soft. There is no mass.  Tenderness: There is no abdominal tenderness. There is no right CVA tenderness, left CVA tenderness, guarding or rebound.     Hernia: No hernia is present.  Genitourinary:    Comments: Breast and pelvic exams deferred with shared decision making Musculoskeletal:        General: No swelling, tenderness, deformity or signs of injury. Normal range of motion.     Cervical back: Normal range of motion and neck supple. No rigidity. No muscular tenderness.     Right lower leg: No edema.     Left lower leg: No edema.  Lymphadenopathy:     Cervical: No cervical adenopathy.  Skin:    General: Skin is warm and dry.     Capillary Refill: Capillary refill takes less than 2 seconds.     Coloration: Skin is not jaundiced or pale.     Findings: No bruising, erythema, lesion or rash.  Neurological:     General: No focal deficit present.     Mental Status: She is alert and oriented to person, place, and time. Mental status is at baseline.     Cranial Nerves: No cranial nerve deficit.     Sensory: No sensory deficit.     Motor: No weakness.     Coordination: Coordination normal.     Gait: Gait normal.     Deep Tendon Reflexes: Reflexes normal.  Psychiatric:        Mood and Affect: Mood normal.        Behavior: Behavior normal.        Thought Content: Thought content normal.        Judgment: Judgment normal.     Results for orders placed or performed in visit on 06/18/19  CBC with Differential/Platelet  Result Value Ref Range   WBC 11.2 (H) 3.4 - 10.8 x10E3/uL   RBC 4.36 3.77 - 5.28 x10E6/uL   Hemoglobin 13.4 11.1 - 15.9 g/dL   Hematocrit 40.9 81.1 - 46.6 %   MCV 92 79 - 97 fL   MCH 30.7 26.6 - 33.0 pg   MCHC 33.3 31 - 35 g/dL   RDW 91.4 78.2 - 95.6 %   Platelets 323 150 - 450 x10E3/uL   Neutrophils 69 Not Estab.  %   Lymphs 24 Not Estab. %   Monocytes 6 Not Estab. %   Eos 1 Not Estab. %   Basos 0 Not Estab. %   Neutrophils Absolute 7.7 (H) 1 - 7 x10E3/uL   Lymphocytes Absolute 2.7 0 - 3 x10E3/uL   Monocytes Absolute 0.7 0 - 0 x10E3/uL   EOS (ABSOLUTE) 0.2 0.0 - 0.4 x10E3/uL   Basophils Absolute 0.0 0 - 0 x10E3/uL   Immature Granulocytes 0 Not Estab. %   Immature Grans (Abs) 0.0 0.0 - 0.1 x10E3/uL  Comprehensive metabolic panel  Result Value Ref Range   Glucose 79 65 - 99 mg/dL   BUN 9 6 - 20 mg/dL   Creatinine, Ser 2.13 0.57 - 1.00 mg/dL   GFR calc non Af Amer 123 >59 mL/min/1.73   GFR calc Af Amer 142 >59 mL/min/1.73   BUN/Creatinine Ratio 13 9 - 23   Sodium 136 134 - 144 mmol/L   Potassium 4.4 3.5 - 5.2 mmol/L   Chloride 102 96 - 106 mmol/L   CO2 21 20 - 29 mmol/L   Calcium 9.4 8.7 - 10.2 mg/dL   Total Protein 6.9 6.0 - 8.5 g/dL   Albumin 4.3 3.9 - 5.0 g/dL   Globulin, Total 2.6  1.5 - 4.5 g/dL   Albumin/Globulin Ratio 1.7 1.2 - 2.2   Bilirubin Total 0.3 0.0 - 1.2 mg/dL   Alkaline Phosphatase 45 39 - 117 IU/L   AST 20 0 - 40 IU/L   ALT 19 0 - 32 IU/L  Lipid Panel w/o Chol/HDL Ratio  Result Value Ref Range   Cholesterol, Total 150 100 - 199 mg/dL   Triglycerides 809 0 - 149 mg/dL   HDL 30 (L) >98 mg/dL   VLDL Cholesterol Cal 28 5 - 40 mg/dL   LDL Calculated 92 0 - 99 mg/dL  TSH  Result Value Ref Range   TSH 1.120 0.450 - 4.500 uIU/mL  QuantiFERON-TB Gold Plus  Result Value Ref Range   QuantiFERON Incubation Incubation performed.    QuantiFERON Criteria Comment    QuantiFERON TB1 Ag Value 0.01 IU/mL   QuantiFERON TB2 Ag Value 0.02 IU/mL   QuantiFERON Nil Value 0.02 IU/mL   QuantiFERON Mitogen Value >10.00 IU/mL   QuantiFERON-TB Gold Plus Negative Negative  VITAMIN D 25 Hydroxy (Vit-D Deficiency, Fractures)  Result Value Ref Range   Vit D, 25-Hydroxy 23.3 (L) 30.0 - 100.0 ng/mL  HM PAP SMEAR  Result Value Ref Range   HM Pap smear neg       Assessment & Plan:    Problem List Items Addressed This Visit      Other   Vitamin D deficiency    Labs drawn today. Await results.      Relevant Orders   VITAMIN D 25 Hydroxy (Vit-D Deficiency, Fractures)    Other Visit Diagnoses    Routine general medical examination at a health care facility    -  Primary   Vaccines up to date. Screening labs checked today. Pap done through GYN. Call with any concerns. Continue diet and exercise.    Relevant Orders   CBC with Differential/Platelet   Comprehensive metabolic panel   Lipid Panel w/o Chol/HDL Ratio   TSH   Urinalysis, Routine w reflex microscopic   Need for hepatitis C screening test       Labs drawn today. Await results.    Relevant Orders   Hepatitis C antibody   Pre-employment health screening examination       Labs drawn today. Await results. Call with any concerns.    Relevant Orders   Hepatitis B surface antibody,quantitative   QuantiFERON-TB Gold Plus   Measles/Mumps/Rubella Immunity   Varicella Zoster IgG and IgM       Follow up plan: Return in about 1 year (around 06/22/2021) for Physical.   LABORATORY TESTING:  - Pap smear: up to date  IMMUNIZATIONS:   - Tdap: Tetanus vaccination status reviewed: last tetanus booster within 10 years. - Influenza: Up to date  PATIENT COUNSELING:   Advised to take 1 mg of folate supplement per day if capable of pregnancy.   Sexuality: Discussed sexually transmitted diseases, partner selection, use of condoms, avoidance of unintended pregnancy  and contraceptive alternatives.   Advised to avoid cigarette smoking.  I discussed with the patient that most people either abstain from alcohol or drink within safe limits (<=14/week and <=4 drinks/occasion for males, <=7/weeks and <= 3 drinks/occasion for females) and that the risk for alcohol disorders and other health effects rises proportionally with the number of drinks per week and how often a drinker exceeds daily limits.  Discussed  cessation/primary prevention of drug use and availability of treatment for abuse.   Diet: Encouraged to adjust caloric intake to maintain  or achieve ideal body weight, to reduce intake of dietary saturated fat and total fat, to limit sodium intake by avoiding high sodium foods and not adding table salt, and to maintain adequate dietary potassium and calcium preferably from fresh fruits, vegetables, and low-fat dairy products.    stressed the importance of regular exercise  Injury prevention: Discussed safety belts, safety helmets, smoke detector, smoking near bedding or upholstery.   Dental health: Discussed importance of regular tooth brushing, flossing, and dental visits.    NEXT PREVENTATIVE PHYSICAL DUE IN 1 YEAR. Return in about 1 year (around 06/22/2021) for Physical.

## 2020-06-22 NOTE — Patient Instructions (Signed)
Health Maintenance, Female Adopting a healthy lifestyle and getting preventive care are important in promoting health and wellness. Ask your health care provider about:  The right schedule for you to have regular tests and exams.  Things you can do on your own to prevent diseases and keep yourself healthy. What should I know about diet, weight, and exercise? Eat a healthy diet   Eat a diet that includes plenty of vegetables, fruits, low-fat dairy products, and lean protein.  Do not eat a lot of foods that are high in solid fats, added sugars, or sodium. Maintain a healthy weight Body mass index (BMI) is used to identify weight problems. It estimates body fat based on height and weight. Your health care provider can help determine your BMI and help you achieve or maintain a healthy weight. Get regular exercise Get regular exercise. This is one of the most important things you can do for your health. Most adults should:  Exercise for at least 150 minutes each week. The exercise should increase your heart rate and make you sweat (moderate-intensity exercise).  Do strengthening exercises at least twice a week. This is in addition to the moderate-intensity exercise.  Spend less time sitting. Even light physical activity can be beneficial. Watch cholesterol and blood lipids Have your blood tested for lipids and cholesterol at 25 years of age, then have this test every 5 years. Have your cholesterol levels checked more often if:  Your lipid or cholesterol levels are high.  You are older than 25 years of age.  You are at high risk for heart disease. What should I know about cancer screening? Depending on your health history and family history, you may need to have cancer screening at various ages. This may include screening for:  Breast cancer.  Cervical cancer.  Colorectal cancer.  Skin cancer.  Lung cancer. What should I know about heart disease, diabetes, and high blood  pressure? Blood pressure and heart disease  High blood pressure causes heart disease and increases the risk of stroke. This is more likely to develop in people who have high blood pressure readings, are of African descent, or are overweight.  Have your blood pressure checked: ? Every 3-5 years if you are 18-39 years of age. ? Every year if you are 40 years old or older. Diabetes Have regular diabetes screenings. This checks your fasting blood sugar level. Have the screening done:  Once every three years after age 40 if you are at a normal weight and have a low risk for diabetes.  More often and at a younger age if you are overweight or have a high risk for diabetes. What should I know about preventing infection? Hepatitis B If you have a higher risk for hepatitis B, you should be screened for this virus. Talk with your health care provider to find out if you are at risk for hepatitis B infection. Hepatitis C Testing is recommended for:  Everyone born from 1945 through 1965.  Anyone with known risk factors for hepatitis C. Sexually transmitted infections (STIs)  Get screened for STIs, including gonorrhea and chlamydia, if: ? You are sexually active and are younger than 24 years of age. ? You are older than 24 years of age and your health care provider tells you that you are at risk for this type of infection. ? Your sexual activity has changed since you were last screened, and you are at increased risk for chlamydia or gonorrhea. Ask your health care provider if   you are at risk.  Ask your health care provider about whether you are at high risk for HIV. Your health care provider may recommend a prescription medicine to help prevent HIV infection. If you choose to take medicine to prevent HIV, you should first get tested for HIV. You should then be tested every 3 months for as long as you are taking the medicine. Pregnancy  If you are about to stop having your period (premenopausal) and  you may become pregnant, seek counseling before you get pregnant.  Take 400 to 800 micrograms (mcg) of folic acid every day if you become pregnant.  Ask for birth control (contraception) if you want to prevent pregnancy. Osteoporosis and menopause Osteoporosis is a disease in which the bones lose minerals and strength with aging. This can result in bone fractures. If you are 65 years old or older, or if you are at risk for osteoporosis and fractures, ask your health care provider if you should:  Be screened for bone loss.  Take a calcium or vitamin D supplement to lower your risk of fractures.  Be given hormone replacement therapy (HRT) to treat symptoms of menopause. Follow these instructions at home: Lifestyle  Do not use any products that contain nicotine or tobacco, such as cigarettes, e-cigarettes, and chewing tobacco. If you need help quitting, ask your health care provider.  Do not use street drugs.  Do not share needles.  Ask your health care provider for help if you need support or information about quitting drugs. Alcohol use  Do not drink alcohol if: ? Your health care provider tells you not to drink. ? You are pregnant, may be pregnant, or are planning to become pregnant.  If you drink alcohol: ? Limit how much you use to 0-1 drink a day. ? Limit intake if you are breastfeeding.  Be aware of how much alcohol is in your drink. In the [place] one drink equals one 12 oz bottle of beer (355 mL), one 5 oz glass of wine (148 mL), or one 1 oz glass of hard liquor (44 mL). General instructions  Schedule regular health, dental, and eye exams.  Stay current with your vaccines.  Tell your health care provider if: ? You often feel depressed. ? You have ever been abused or do not feel safe at home. Summary  Adopting a healthy lifestyle and getting preventive care are important in promoting health and wellness.  Follow your health care provider's instructions about healthy  diet, exercising, and getting tested or screened for diseases.  Follow your health care provider's instructions on monitoring your cholesterol and blood pressure. This information is not intended to replace advice given to you by your health care provider. Make sure you discuss any questions you have with your health care provider. Document Revised: 12/05/2018 Document Reviewed: 12/05/2018 Elsevier Patient Education  2020 [employer]  

## 2020-06-24 LAB — CBC WITH DIFFERENTIAL/PLATELET
Basophils Absolute: 0 10*3/uL (ref 0.0–0.2)
Basos: 0 %
EOS (ABSOLUTE): 0.1 10*3/uL (ref 0.0–0.4)
Eos: 1 %
Hematocrit: 41.1 % (ref 34.0–46.6)
Hemoglobin: 13.9 g/dL (ref 11.1–15.9)
Immature Grans (Abs): 0 10*3/uL (ref 0.0–0.1)
Immature Granulocytes: 0 %
Lymphocytes Absolute: 2.6 10*3/uL (ref 0.7–3.1)
Lymphs: 21 %
MCH: 30.4 pg (ref 26.6–33.0)
MCHC: 33.8 g/dL (ref 31.5–35.7)
MCV: 90 fL (ref 79–97)
Monocytes Absolute: 0.4 10*3/uL (ref 0.1–0.9)
Monocytes: 3 %
Neutrophils Absolute: 9.2 10*3/uL — ABNORMAL HIGH (ref 1.4–7.0)
Neutrophils: 75 %
Platelets: 345 10*3/uL (ref 150–450)
RBC: 4.57 x10E6/uL (ref 3.77–5.28)
RDW: 11.6 % — ABNORMAL LOW (ref 11.7–15.4)
WBC: 12.4 10*3/uL — ABNORMAL HIGH (ref 3.4–10.8)

## 2020-06-24 LAB — COMPREHENSIVE METABOLIC PANEL
ALT: 21 IU/L (ref 0–32)
AST: 17 IU/L (ref 0–40)
Albumin/Globulin Ratio: 1.7 (ref 1.2–2.2)
Albumin: 4.7 g/dL (ref 3.9–5.0)
Alkaline Phosphatase: 47 IU/L — ABNORMAL LOW (ref 48–121)
BUN/Creatinine Ratio: 13 (ref 9–23)
BUN: 10 mg/dL (ref 6–20)
Bilirubin Total: 0.5 mg/dL (ref 0.0–1.2)
CO2: 21 mmol/L (ref 20–29)
Calcium: 9.3 mg/dL (ref 8.7–10.2)
Chloride: 101 mmol/L (ref 96–106)
Creatinine, Ser: 0.79 mg/dL (ref 0.57–1.00)
GFR calc Af Amer: 120 mL/min/{1.73_m2} (ref >59)
GFR calc non Af Amer: 104 mL/min/{1.73_m2} (ref >59)
Globulin, Total: 2.7 g/dL (ref 1.5–4.5)
Glucose: 112 mg/dL — ABNORMAL HIGH (ref 65–99)
Potassium: 4.2 mmol/L (ref 3.5–5.2)
Sodium: 138 mmol/L (ref 134–144)
Total Protein: 7.4 g/dL (ref 6.0–8.5)

## 2020-06-24 LAB — VARICELLA ZOSTER ABS, IGG/IGM
Varicella IgM: <0.91 index (ref 0.00–0.90)
Varicella zoster IgG: 557 index (ref >165)

## 2020-06-24 LAB — LIPID PANEL W/O CHOL/HDL RATIO
Cholesterol, Total: 194 mg/dL (ref 100–199)
HDL: 33 mg/dL — ABNORMAL LOW (ref >39)
LDL Chol Calc (NIH): 133 mg/dL — ABNORMAL HIGH (ref 0–99)
Triglycerides: 152 mg/dL — ABNORMAL HIGH (ref 0–149)
VLDL Cholesterol Cal: 28 mg/dL (ref 5–40)

## 2020-06-24 LAB — QUANTIFERON-TB GOLD PLUS
QuantiFERON Mitogen Value: >10 IU/mL
QuantiFERON Nil Value: 0 IU/mL
QuantiFERON TB1 Ag Value: 0 IU/mL
QuantiFERON TB2 Ag Value: 0 IU/mL
QuantiFERON-TB Gold Plus: NEGATIVE

## 2020-06-24 LAB — HEPATITIS B SURFACE ANTIBODY, QUANTITATIVE: Hepatitis B Surf Ab Quant: 14.3 m[IU]/mL (ref >9.9)

## 2020-06-24 LAB — HEPATITIS C ANTIBODY: Hep C Virus Ab: <0.1 s/co ratio (ref 0.0–0.9)

## 2020-06-24 LAB — MEASLES/MUMPS/RUBELLA IMMUNITY
MUMPS ABS, IGG: 11.9 AU/mL (ref >10.9)
RUBEOLA AB, IGG: 63.8 AU/mL (ref >16.4)
Rubella Antibodies, IGG: 2.03 index (ref >0.99)

## 2020-06-24 LAB — TSH: TSH: 1.5 u[IU]/mL (ref 0.450–4.500)

## 2020-06-24 LAB — VITAMIN D 25 HYDROXY (VIT D DEFICIENCY, FRACTURES): Vit D, 25-Hydroxy: 22.1 ng/mL — ABNORMAL LOW (ref 30.0–100.0)

## 2020-07-02 ENCOUNTER — Telehealth: Payer: Self-pay | Admitting: Family Medicine

## 2020-07-02 NOTE — Telephone Encounter (Signed)
Placed in provider folder

## 2020-07-02 NOTE — Telephone Encounter (Signed)
Sending to [facility] for review.

## 2020-07-02 NOTE — Telephone Encounter (Signed)
Pt brought in forms to be filled for physical she had on 06/22/2020,paper work was left in bin  .

## 2020-07-10 ENCOUNTER — Telehealth: Payer: Self-pay

## 2020-07-10 NOTE — Telephone Encounter (Signed)
Patient would like to stop in today after lpm before 3pm  for the vision check and to pick up the paperwork.

## 2020-07-10 NOTE — Telephone Encounter (Signed)
Patient picked up paperwork.

## 2020-11-03 LAB — HM PAP SMEAR

## 2020-11-06 LAB — HM PAP SMEAR

## 2021-06-25 ENCOUNTER — Encounter: Payer: 59 | Admitting: Family Medicine

## 2021-08-03 ENCOUNTER — Other Ambulatory Visit: Payer: Self-pay

## 2021-08-03 ENCOUNTER — Ambulatory Visit (INDEPENDENT_AMBULATORY_CARE_PROVIDER_SITE_OTHER): Payer: 59 | Admitting: Family Medicine

## 2021-08-03 ENCOUNTER — Encounter: Payer: Self-pay | Admitting: Family Medicine

## 2021-08-03 VITALS — BP 111/76 | HR 79 | Temp 97.5°F | Ht 62.2 in | Wt 148.2 lb

## 2021-08-03 DIAGNOSIS — Z0000 Encounter for general adult medical examination without abnormal findings: Secondary | ICD-10-CM | POA: Diagnosis not present

## 2021-08-03 DIAGNOSIS — Z111 Encounter for screening for respiratory tuberculosis: Secondary | ICD-10-CM

## 2021-08-03 LAB — URINALYSIS, ROUTINE W REFLEX MICROSCOPIC
Bilirubin, UA: NEGATIVE
Glucose, UA: NEGATIVE
Ketones, UA: NEGATIVE
Leukocytes,UA: NEGATIVE
Nitrite, UA: NEGATIVE
Protein,UA: NEGATIVE
Specific Gravity, UA: 1.02 (ref 1.005–1.030)
Urobilinogen, Ur: 1 mg/dL (ref 0.2–1.0)
pH, UA: 7 (ref 5.0–7.5)

## 2021-08-03 LAB — MICROSCOPIC EXAMINATION
Bacteria, UA: NONE SEEN
WBC, UA: NONE SEEN /hpf (ref 0–5)

## 2021-08-03 NOTE — Progress Notes (Signed)
BP 111/76   Pulse 79   Temp (!) 97.5 F (36.4 C) (Oral)   Ht 5' 2.2" (1.58 m)   Wt 148 lb 3.2 oz (67.2 kg)   LMP 07/19/2021 (Approximate)   SpO2 98%   BMI 26.93 kg/m    Subjective:    Patient ID: Claudia Hanson, female    DOB: [birth year], 25 y.o.   MRN: [id number]  HPI: Claudia Hanson is a 25 y.o. female presenting on 08/03/2021 for comprehensive medical examination. Current medical complaints include:none  Menopausal Symptoms: no  Depression Screen done today and results listed below:  Depression screen [facility] 2/9 08/03/2021 06/22/2020 06/18/2019 11/30/2018 02/12/2016  Decreased Interest 0 1 0 0 0  Down, Depressed, Hopeless 0 1 0 0 1  PHQ - 2 Score 0 2 0 0 1  Altered sleeping - 0 - 0 0  Tired, decreased energy - 1 - 1 0  Change in appetite - 1 - 0 2  Feeling bad or failure about yourself  - 0 - 0 1  Trouble concentrating - 0 - 0 2  Moving slowly or fidgety/restless - 0 - 0 0  Suicidal thoughts - 0 - 0 0  PHQ-9 Score - 4 - 1 6  Difficult doing work/chores - Not difficult at all - Not difficult at all -     Past Medical History:  Past Medical History:  Diagnosis Date   Gastritis    Generalized headaches    Vitamin D deficiency     Surgical History:  Past Surgical History:  Procedure Laterality Date   WISDOM TOOTH EXTRACTION      Medications:  Current Outpatient Medications on File Prior to Visit  Medication Sig   JUNEL FE 1.5/30 1.5-30 MG-MCG tablet Take 1 tablet by mouth daily.   No current facility-administered medications on file prior to visit.    Allergies:  No Known Allergies  Social History:  Social History   Socioeconomic History   Marital status: Single    Spouse name: Not on file   Number of children: 0   Years of education: Not on file   Highest education level: Not on file  Occupational History   Occupation: student  Tobacco Use   Smoking status: Never   Smokeless tobacco: Never  Vaping Use   Vaping Use: Never used  Substance and Sexual  Activity   Alcohol use: Yes    Alcohol/week: 0.0 standard drinks    Comment: social   Drug use: No   Sexual activity: Not Currently  Other Topics Concern   Not on file  Social History Narrative   Not on file   Social Determinants of Health   Financial Resource Strain: Not on file  Food Insecurity: Not on file  Transportation Needs: Not on file  Physical Activity: Not on file  Stress: Not on file  Social Connections: Not on file  Intimate Partner Violence: Not on file   Social History   Tobacco Use  Smoking Status Never  Smokeless Tobacco Never   Social History   Substance and Sexual Activity  Alcohol Use Yes   Alcohol/week: 0.0 standard drinks   Comment: social    Family History:  Family History  Problem Relation Age of Onset   Stroke Maternal Grandmother    Diabetes Paternal Grandmother    Heart disease Paternal Grandfather        MI   Colon cancer Neg Hx     Past medical history, surgical history, medications, allergies, family history  and social history reviewed with patient today and changes made to appropriate areas of the chart.   Review of Systems  Constitutional: Negative.   HENT: Negative.    Eyes: Negative.   Respiratory: Negative.    Cardiovascular: Negative.   Gastrointestinal: Negative.   Genitourinary: Negative.   Musculoskeletal:  Positive for myalgias. Negative for back pain, falls, joint pain and neck pain.  Skin: Negative.   Neurological: Negative.   Endo/Heme/Allergies: Negative.   Psychiatric/Behavioral: Negative.    All other ROS negative except what is listed above and in the HPI.      Objective:    BP 111/76   Pulse 79   Temp (!) 97.5 F (36.4 C) (Oral)   Ht 5' 2.2" (1.58 m)   Wt 148 lb 3.2 oz (67.2 kg)   LMP 07/19/2021 (Approximate)   SpO2 98%   BMI 26.93 kg/m   Wt Readings from Last 3 Encounters:  08/03/21 148 lb 3.2 oz (67.2 kg)  06/22/20 145 lb (65.8 kg)  06/18/19 157 lb 6.4 oz (71.4 kg)    Physical  Exam Vitals and nursing note reviewed.  Constitutional:      General: She is not in acute distress.    Appearance: Normal appearance. She is not ill-appearing, toxic-appearing or diaphoretic.  HENT:     Head: Normocephalic and atraumatic.     Right Ear: Tympanic membrane, ear canal and external ear normal. There is no impacted cerumen.     Left Ear: Tympanic membrane, ear canal and external ear normal. There is no impacted cerumen.     Nose: Nose normal. No congestion or rhinorrhea.     Mouth/Throat:     Mouth: Mucous membranes are moist.     Pharynx: Oropharynx is clear. No oropharyngeal exudate or posterior oropharyngeal erythema.  Eyes:     General: No scleral icterus.       Right eye: No discharge.        Left eye: No discharge.     Extraocular Movements: Extraocular movements intact.     Conjunctiva/sclera: Conjunctivae normal.     Pupils: Pupils are equal, round, and reactive to light.  Neck:     Vascular: No carotid bruit.  Cardiovascular:     Rate and Rhythm: Normal rate and regular rhythm.     Pulses: Normal pulses.     Heart sounds: No murmur heard.   No friction rub. No gallop.  Pulmonary:     Effort: Pulmonary effort is normal. No respiratory distress.     Breath sounds: Normal breath sounds. No stridor. No wheezing, rhonchi or rales.  Chest:     Chest wall: No tenderness.  Abdominal:     General: Abdomen is flat. Bowel sounds are normal. There is no distension.     Palpations: Abdomen is soft. There is no mass.     Tenderness: There is no abdominal tenderness. There is no right CVA tenderness, left CVA tenderness, guarding or rebound.     Hernia: No hernia is present.  Genitourinary:    Comments: Breast and pelvic exams deferred with shared decision making Musculoskeletal:        General: No swelling, tenderness, deformity or signs of injury.     Cervical back: Normal range of motion and neck supple. No rigidity. No muscular tenderness.     Right lower leg: No  edema.     Left lower leg: No edema.  Lymphadenopathy:     Cervical: No cervical adenopathy.  Skin:  General: Skin is warm and dry.     Capillary Refill: Capillary refill takes less than 2 seconds.     Coloration: Skin is not jaundiced or pale.     Findings: No bruising, erythema, lesion or rash.  Neurological:     General: No focal deficit present.     Mental Status: She is alert and oriented to person, place, and time. Mental status is at baseline.     Cranial Nerves: No cranial nerve deficit.     Sensory: No sensory deficit.     Motor: No weakness.     Coordination: Coordination normal.     Gait: Gait normal.     Deep Tendon Reflexes: Reflexes normal.  Psychiatric:        Mood and Affect: Mood normal.        Behavior: Behavior normal.        Thought Content: Thought content normal.        Judgment: Judgment normal.    Results for orders placed or performed in visit on 06/22/20  Microscopic Examination   Urine  Result Value Ref Range   WBC, UA 6-10 (A) 0 - 5 /hpf   RBC 3-10 (A) 0 - 2 /hpf   Epithelial Cells (non renal) 0-10 0 - 10 /hpf   Mucus, UA Present Not Estab.   Bacteria, UA Moderate (A) None seen/Few  CBC with Differential/Platelet  Result Value Ref Range   WBC 12.4 (H) 3.4 - 10.8 x10E3/uL   RBC 4.57 3.77 - 5.28 x10E6/uL   Hemoglobin 13.9 11.1 - 15.9 g/dL   Hematocrit 28.3 66.2 - 46.6 %   MCV 90 79 - 97 fL   MCH 30.4 26.6 - 33.0 pg   MCHC 33.8 31.5 - 35.7 g/dL   RDW 94.7 (L) 65.4 - 65.0 %   Platelets 345 150 - 450 x10E3/uL   Neutrophils 75 Not Estab. %   Lymphs 21 Not Estab. %   Monocytes 3 Not Estab. %   Eos 1 Not Estab. %   Basos 0 Not Estab. %   Neutrophils Absolute 9.2 (H) 1.4 - 7.0 x10E3/uL   Lymphocytes Absolute 2.6 0.7 - 3.1 x10E3/uL   Monocytes Absolute 0.4 0.1 - 0.9 x10E3/uL   EOS (ABSOLUTE) 0.1 0.0 - 0.4 x10E3/uL   Basophils Absolute 0.0 0.0 - 0.2 x10E3/uL   Immature Granulocytes 0 Not Estab. %   Immature Grans (Abs) 0.0 0.0 - 0.1 x10E3/uL   Comprehensive metabolic panel  Result Value Ref Range   Glucose 112 (H) 65 - 99 mg/dL   BUN 10 6 - 20 mg/dL   Creatinine, Ser 3.54 0.57 - 1.00 mg/dL   GFR calc non Af Amer 104 >59 mL/min/1.73   GFR calc Af Amer 120 >59 mL/min/1.73   BUN/Creatinine Ratio 13 9 - 23   Sodium 138 134 - 144 mmol/L   Potassium 4.2 3.5 - 5.2 mmol/L   Chloride 101 96 - 106 mmol/L   CO2 21 20 - 29 mmol/L   Calcium 9.3 8.7 - 10.2 mg/dL   Total Protein 7.4 6.0 - 8.5 g/dL   Albumin 4.7 3.9 - 5.0 g/dL   Globulin, Total 2.7 1.5 - 4.5 g/dL   Albumin/Globulin Ratio 1.7 1.2 - 2.2   Bilirubin Total 0.5 0.0 - 1.2 mg/dL   Alkaline Phosphatase 47 (L) 48 - 121 IU/L   AST 17 0 - 40 IU/L   ALT 21 0 - 32 IU/L  Lipid Panel w/o Chol/HDL Ratio  Result Value Ref  Range   Cholesterol, Total 194 100 - 199 mg/dL   Triglycerides 591 (H) 0 - 149 mg/dL   HDL 33 (L) >63 mg/dL   VLDL Cholesterol Cal 28 5 - 40 mg/dL   LDL Chol Calc (NIH) 846 (H) 0 - 99 mg/dL  TSH  Result Value Ref Range   TSH 1.500 0.450 - 4.500 uIU/mL  Urinalysis, Routine w reflex microscopic  Result Value Ref Range   Specific Gravity, UA 1.025 1.005 - 1.030   pH, UA 6.0 5.0 - 7.5   Color, UA Yellow Yellow   Appearance Ur Hazy (A) Clear   Leukocytes,UA Trace (A) Negative   Protein,UA Negative Negative/Trace   Glucose, UA Trace (A) Negative   Ketones, UA Negative Negative   RBC, UA 1+ (A) Negative   Bilirubin, UA Negative Negative   Urobilinogen, Ur 0.2 0.2 - 1.0 mg/dL   Nitrite, UA Negative Negative   Microscopic Examination See below:   VITAMIN D 25 Hydroxy (Vit-D Deficiency, Fractures)  Result Value Ref Range   Vit D, 25-Hydroxy 22.1 (L) 30.0 - 100.0 ng/mL  Hepatitis C antibody  Result Value Ref Range   Hep C Virus Ab <0.1 0.0 - 0.9 s/co ratio  Hepatitis B surface antibody,quantitative  Result Value Ref Range   Hepatitis B Surf Ab Quant 14.3 Immunity>9.9 mIU/mL  QuantiFERON-TB Gold Plus  Result Value Ref Range   QuantiFERON Incubation  Incubation performed.    QuantiFERON Criteria Comment    QuantiFERON TB1 Ag Value 0.00 IU/mL   QuantiFERON TB2 Ag Value 0.00 IU/mL   QuantiFERON Nil Value 0.00 IU/mL   QuantiFERON Mitogen Value >10.00 IU/mL   QuantiFERON-TB Gold Plus Negative Negative  Measles/Mumps/Rubella Immunity  Result Value Ref Range   Rubella Antibodies, IGG 2.03 Immune >0.99 index   RUBEOLA AB, IGG 63.8 Immune >16.4 AU/mL   MUMPS ABS, IGG 11.9 Immune >10.9 AU/mL  Varicella Zoster Abs, IgG/IgM  Result Value Ref Range   Varicella zoster IgG 557 Immune >165 index   Varicella IgM <0.91 0.00 - 0.90 index      Assessment & Plan:   Problem List Items Addressed This Visit   None Visit Diagnoses     Routine general medical examination at a health care facility    -  Primary   Vaccines up to date. Screening labs checked today. Pap up to date. Continue diet and exercise. Call with any concerns.    Relevant Orders   CBC with Differential/Platelet   Comprehensive metabolic panel   Lipid Panel w/o Chol/HDL Ratio   Urinalysis, Routine w reflex microscopic   TSH   Magnesium   Phosphorus   Screening for tuberculosis       Labs drawn today. Await results.    Relevant Orders   QuantiFERON-TB Gold Plus        Follow up plan: Return in about 1 year (around 08/03/2022) for physical, records release pap from [facility].   LABORATORY TESTING:  - Pap smear: done elsewhere  IMMUNIZATIONS:   - Tdap: Tetanus vaccination status reviewed: last tetanus booster within 10 years. - Influenza: Postponed to flu season - Pneumovax: Not applicable - COVID: Up to date - HPV: Up to date  PATIENT COUNSELING:   Advised to take 1 mg of folate supplement per day if capable of pregnancy.   Sexuality: Discussed sexually transmitted diseases, partner selection, use of condoms, avoidance of unintended pregnancy  and contraceptive alternatives.   Advised to avoid cigarette smoking.  I discussed with the patient  that most people  either abstain from alcohol or drink within safe limits (<=14/week and <=4 drinks/occasion for males, <=7/weeks and <= 3 drinks/occasion for females) and that the risk for alcohol disorders and other health effects rises proportionally with the number of drinks per week and how often a drinker exceeds daily limits.  Discussed cessation/primary prevention of drug use and availability of treatment for abuse.   Diet: Encouraged to adjust caloric intake to maintain  or achieve ideal body weight, to reduce intake of dietary saturated fat and total fat, to limit sodium intake by avoiding high sodium foods and not adding table salt, and to maintain adequate dietary potassium and calcium preferably from fresh fruits, vegetables, and low-fat dairy products.    stressed the importance of regular exercise  Injury prevention: Discussed safety belts, safety helmets, smoke detector, smoking near bedding or upholstery.   Dental health: Discussed importance of regular tooth brushing, flossing, and dental visits.    NEXT PREVENTATIVE PHYSICAL DUE IN 1 YEAR. Return in about 1 year (around 08/03/2022) for physical, records release pap from [facility].

## 2021-08-10 LAB — COMPREHENSIVE METABOLIC PANEL
ALT: 14 IU/L (ref 0–32)
AST: 15 IU/L (ref 0–40)
Albumin/Globulin Ratio: 1.8 (ref 1.2–2.2)
Albumin: 4.4 g/dL (ref 3.9–5.0)
Alkaline Phosphatase: 38 IU/L — ABNORMAL LOW (ref 44–121)
BUN/Creatinine Ratio: 11 (ref 9–23)
BUN: 8 mg/dL (ref 6–20)
Bilirubin Total: 0.4 mg/dL (ref 0.0–1.2)
CO2: 20 mmol/L (ref 20–29)
Calcium: 8.9 mg/dL (ref 8.7–10.2)
Chloride: 102 mmol/L (ref 96–106)
Creatinine, Ser: 0.76 mg/dL (ref 0.57–1.00)
Globulin, Total: 2.5 g/dL (ref 1.5–4.5)
Glucose: 75 mg/dL (ref 65–99)
Potassium: 4.4 mmol/L (ref 3.5–5.2)
Sodium: 137 mmol/L (ref 134–144)
Total Protein: 6.9 g/dL (ref 6.0–8.5)
eGFR: 111 mL/min/{1.73_m2} (ref >59)

## 2021-08-10 LAB — PHOSPHORUS: Phosphorus: 3.6 mg/dL (ref 3.0–4.3)

## 2021-08-10 LAB — CBC WITH DIFFERENTIAL/PLATELET
Basophils Absolute: 0.1 10*3/uL (ref 0.0–0.2)
Basos: 1 %
EOS (ABSOLUTE): 0.2 10*3/uL (ref 0.0–0.4)
Eos: 3 %
Hematocrit: 39.5 % (ref 34.0–46.6)
Hemoglobin: 12.9 g/dL (ref 11.1–15.9)
Immature Grans (Abs): 0 10*3/uL (ref 0.0–0.1)
Immature Granulocytes: 0 %
Lymphocytes Absolute: 2.8 10*3/uL (ref 0.7–3.1)
Lymphs: 32 %
MCH: 30.1 pg (ref 26.6–33.0)
MCHC: 32.7 g/dL (ref 31.5–35.7)
MCV: 92 fL (ref 79–97)
Monocytes Absolute: 0.5 10*3/uL (ref 0.1–0.9)
Monocytes: 6 %
Neutrophils Absolute: 5.2 10*3/uL (ref 1.4–7.0)
Neutrophils: 58 %
Platelets: 318 10*3/uL (ref 150–450)
RBC: 4.29 x10E6/uL (ref 3.77–5.28)
RDW: 11.7 % (ref 11.7–15.4)
WBC: 8.7 10*3/uL (ref 3.4–10.8)

## 2021-08-10 LAB — QUANTIFERON-TB GOLD PLUS
QuantiFERON Mitogen Value: >10 IU/mL
QuantiFERON Nil Value: 4.26 IU/mL
QuantiFERON TB1 Ag Value: 0.02 IU/mL
QuantiFERON TB2 Ag Value: 0.03 IU/mL
QuantiFERON-TB Gold Plus: NEGATIVE

## 2021-08-10 LAB — TSH: TSH: 3.08 u[IU]/mL (ref 0.450–4.500)

## 2021-08-10 LAB — LIPID PANEL W/O CHOL/HDL RATIO
Cholesterol, Total: 183 mg/dL (ref 100–199)
HDL: 29 mg/dL — ABNORMAL LOW (ref >39)
LDL Chol Calc (NIH): 141 mg/dL — ABNORMAL HIGH (ref 0–99)
Triglycerides: 68 mg/dL (ref 0–149)
VLDL Cholesterol Cal: 13 mg/dL (ref 5–40)

## 2021-08-10 LAB — MAGNESIUM: Magnesium: 2.1 mg/dL (ref 1.6–2.3)

## 2021-09-06 ENCOUNTER — Encounter (HOSPITAL_COMMUNITY): Payer: Self-pay

## 2021-09-06 ENCOUNTER — Ambulatory Visit (HOSPITAL_COMMUNITY)
Admission: EM | Admit: 2021-09-06 | Discharge: 2021-09-06 | Disposition: A | Discharge status: Home / Self Care | Payer: 59 | Attending: Internal Medicine | Admitting: Internal Medicine

## 2021-09-06 ENCOUNTER — Ambulatory Visit: Payer: Self-pay

## 2021-09-06 ENCOUNTER — Other Ambulatory Visit: Payer: Self-pay

## 2021-09-06 DIAGNOSIS — T7840XA Allergy, unspecified, initial encounter: Secondary | ICD-10-CM | POA: Diagnosis present

## 2021-09-06 LAB — CBC WITH DIFFERENTIAL/PLATELET
Abs Immature Granulocytes: 0.05 10*3/uL (ref 0.00–0.07)
Basophils Absolute: 0 10*3/uL (ref 0.0–0.1)
Basophils Relative: 0 %
Eosinophils Absolute: 0.4 10*3/uL (ref 0.0–0.5)
Eosinophils Relative: 3 %
HCT: 39 % (ref 36.0–46.0)
Hemoglobin: 13 g/dL (ref 12.0–15.0)
Immature Granulocytes: 0 %
Lymphocytes Relative: 28 %
Lymphs Abs: 3.3 10*3/uL (ref 0.7–4.0)
MCH: 31.1 pg (ref 26.0–34.0)
MCHC: 33.3 g/dL (ref 30.0–36.0)
MCV: 93.3 fL (ref 80.0–100.0)
Monocytes Absolute: 0.8 10*3/uL (ref 0.1–1.0)
Monocytes Relative: 7 %
Neutro Abs: 7 10*3/uL (ref 1.7–7.7)
Neutrophils Relative %: 62 %
Platelets: 338 10*3/uL (ref 150–400)
RBC: 4.18 MIL/uL (ref 3.87–5.11)
RDW: 13 % (ref 11.5–15.5)
WBC: 11.5 10*3/uL — ABNORMAL HIGH (ref 4.0–10.5)
nRBC: 0 % (ref 0.0–0.2)

## 2021-09-06 LAB — COMPREHENSIVE METABOLIC PANEL
ALT: 18 U/L (ref 0–44)
AST: 20 U/L (ref 15–41)
Albumin: 4.1 g/dL (ref 3.5–5.0)
Alkaline Phosphatase: 35 U/L — ABNORMAL LOW (ref 38–126)
Anion gap: 5 (ref 5–15)
BUN: 11 mg/dL (ref 6–20)
CO2: 25 mmol/L (ref 22–32)
Calcium: 9.3 mg/dL (ref 8.9–10.3)
Chloride: 106 mmol/L (ref 98–111)
Creatinine, Ser: 0.67 mg/dL (ref 0.44–1.00)
GFR, Estimated: >60 mL/min (ref >60)
Glucose, Bld: 90 mg/dL (ref 70–99)
Potassium: 4.2 mmol/L (ref 3.5–5.1)
Sodium: 136 mmol/L (ref 135–145)
Total Bilirubin: 0.5 mg/dL (ref 0.3–1.2)
Total Protein: 7.3 g/dL (ref 6.5–8.1)

## 2021-09-06 MED ORDER — PREDNISONE 20 MG PO TABS: 20.0000 mg | ORAL_TABLET | Freq: Every day | ORAL | 0 refills | Status: AC

## 2021-09-06 MED ORDER — HYDROXYZINE HCL 25 MG PO TABS: 25.0000 mg | ORAL_TABLET | Freq: Four times a day (QID) | ORAL | 0 refills | Status: DC

## 2021-09-06 NOTE — Discharge Instructions (Addendum)
Please take medications as prescribed If symptoms worsen please return to the urgent care to be reevaluated. We will call you with recommendations if labs are abnormal.

## 2021-09-06 NOTE — ED Triage Notes (Signed)
Pt presents with itching rash that is localized from the waist up. Pt states she has had the rash x2 days. Pt states she used a new soap on Saturday/Sunday.

## 2021-09-06 NOTE — ED Provider Notes (Signed)
[facility]    CSN: [id number] Arrival date & time: 09/06/21  1814      History   Chief Complaint Chief Complaint  Patient presents with   Rash    HPI Claudia Hanson is a 25 y.o. female comes to the urgent care with a 2-day history of generalized pruritic rash over the torso and upper extremities as well as the face.  Patient denies any change in cosmetics.  She switched her soap to [employer] over the past couple of days.  No shortness of breath or wheezing.  No significant erythema.  No joint pain.  No fever or chills.  HPI  Past Medical History:  Diagnosis Date   Gastritis    Generalized headaches    Vitamin D deficiency     Patient Active Problem List   Diagnosis Date Noted   Vitamin D deficiency 04/01/2016   Allergic rhinitis 12/07/2015    Past Surgical History:  Procedure Laterality Date   WISDOM TOOTH EXTRACTION      OB History   No obstetric history on file.      Home Medications    Prior to Admission medications   Medication Sig Start Date End Date Taking? Authorizing Provider  hydrOXYzine (ATARAX/VISTARIL) 25 MG tablet Take 1 tablet (25 mg total) by mouth every 6 (six) hours. 09/06/21  Yes Akhil Piscopo, Britta Mccreedy, MD  predniSONE (DELTASONE) 20 MG tablet Take 1 tablet (20 mg total) by mouth daily for 5 days. 09/06/21 09/11/21 Yes Delmer Kowalski, Britta Mccreedy, MD  JUNEL FE 1.5/30 1.5-30 MG-MCG tablet Take 1 tablet by mouth daily. 10/15/18   [provider]    Family History Family History  Problem Relation Age of Onset   Stroke Maternal Grandmother    Diabetes Paternal Grandmother    Heart disease Paternal Grandfather        MI   Colon cancer Neg Hx     Social History Social History   Tobacco Use   Smoking status: Never   Smokeless tobacco: Never  Vaping Use   Vaping Use: Never used  Substance Use Topics   Alcohol use: Yes    Alcohol/week: 0.0 standard drinks    Comment: social   Drug use: No     Allergies   Patient has no known  allergies.   Review of Systems Review of Systems  Respiratory: Negative.    Gastrointestinal: Negative.   Musculoskeletal: Negative.   Skin:  Positive for rash. Negative for color change, pallor and wound.    Physical Exam Triage Vital Signs ED Triage Vitals  Enc Vitals Group     BP 09/06/21 1908 135/75     Pulse Rate 09/06/21 1908 79     Resp 09/06/21 1908 12     Temp 09/06/21 1908 98.3 F (36.8 C)     Temp Source 09/06/21 1908 Oral     SpO2 09/06/21 1908 98 %     Weight 09/06/21 1910 152 lb (68.9 kg)     Height 09/06/21 1910 5\' 2"  (1.575 m)     Head Circumference --      Peak Flow --      Pain Score 09/06/21 1910 0     Pain Loc --      Pain Edu? --      Excl. in GC? --    No data found.  Updated Vital Signs BP 135/75 (BP Location: Left Arm)   Pulse 79   Temp 98.3 F (36.8 C) (Oral)   Resp  12   Ht 5\' 2"  (1.575 m)   Wt 68.9 kg   SpO2 98%   BMI 27.80 kg/m   Visual Acuity Right Eye Distance:   Left Eye Distance:   Bilateral Distance:    Right Eye Near:   Left Eye Near:    Bilateral Near:     Physical Exam Vitals and nursing note reviewed.  Constitutional:      General: She is not in acute distress.    Appearance: She is not ill-appearing.  HENT:     Mouth/Throat:     Pharynx: No posterior oropharyngeal erythema.  Eyes:     Extraocular Movements: Extraocular movements intact.     Pupils: Pupils are equal, round, and reactive to light.  Cardiovascular:     Rate and Rhythm: Normal rate and regular rhythm.  Pulmonary:     Effort: Pulmonary effort is normal.     Breath sounds: Normal breath sounds.  Abdominal:     General: Abdomen is flat.     Tenderness: There is no abdominal tenderness. There is no guarding or rebound.  Skin:    General: Skin is warm.     Findings: Erythema and rash present.     Comments: Few urticarial rash noted on the face as well as the upper back.  Neurological:     Mental Status: She is alert.     UC Treatments /  Results  Labs (all labs ordered are listed, but only abnormal results are displayed) Labs Reviewed  COMPREHENSIVE METABOLIC PANEL  CBC WITH DIFFERENTIAL/PLATELET    EKG   Radiology No results found.  Procedures Procedures (including critical care time)  Medications Ordered in UC Medications - No data to display  Initial Impression / Assessment and Plan / UC Course  I have reviewed the triage vital signs and the nursing notes.  Pertinent labs & imaging results that were available during my care of the patient were reviewed by me and considered in my medical decision making (see chart for details).     1.  Allergic rash: Prednisone 20 mg orally daily for 5 days Hydroxyzine 25 mg every 6 hours as needed Emollient use on the skin is advised Return to urgent care if symptoms worsen CBC, CMP We will call you with recommendations if labs are abnormal Final Clinical Impressions(s) / UC Diagnoses   Final diagnoses:  Allergic rash present on examination     Discharge Instructions      Please take medications as prescribed If symptoms worsen please return to the urgent care to be reevaluated. We will call you with recommendations if labs are abnormal.   ED Prescriptions     Medication Sig Dispense Auth. Provider   hydrOXYzine (ATARAX/VISTARIL) 25 MG tablet Take 1 tablet (25 mg total) by mouth every 6 (six) hours. 20 tablet Bruchy Mikel, , MD   predniSONE (DELTASONE) 20 MG tablet Take 1 tablet (20 mg total) by mouth daily for 5 days. 5 tablet Isabell Bonafede, Britta Mccreedy, MD      PDMP not reviewed this encounter.   Britta Mccreedy, MD 09/06/21 [phone]

## 2021-09-06 NOTE — Telephone Encounter (Signed)
Pt. Reports she started having a rash Saturday. Did use a new soap. Rash is behind her ears, trunk and arms. Small red bumps. Itchy. No availability in the practice. Would like to be worked in. No answer on FC line. Please advise pt.

## 2021-09-06 NOTE — Telephone Encounter (Signed)
Called and spoke to patient. She states that she has used some calamine lotion that has helped some. Offered appointment for tomorrow but patient declined. Patient states she will keep an eye on the rash and call us back if it is not getting better or worse.

## 2021-09-06 NOTE — Telephone Encounter (Signed)
Patient has itchy rash on stomach and behind ear since Saturday. Please call back      Reason for Disposition  SEVERE itching (i.e., interferes with sleep, normal activities or school)  Answer Assessment - Initial Assessment Questions 1. APPEARANCE of RASH: "Describe the rash." (e.g., spots, blisters, raised areas, skin peeling, scaly)     Red 2. SIZE: "How big are the spots?" (e.g., tip of pen, eraser, coin; inches, centimeters)     Tip of pen 3. LOCATION: "Where is the rash located?"     Behind ears, stomach, chest arms 4. COLOR: "What color is the rash?" (Note: It is difficult to assess rash color in people with darker-colored skin. When this situation occurs, simply ask the caller to describe what they see.)     Red 5. ONSET: "When did the rash begin?"     Saturday 6. FEVER: "Do you have a fever?" If Yes, ask: "What is your temperature, how was it measured, and when did it start?"     No 7. ITCHING: "Does the rash itch?" If Yes, ask: "How bad is the itch?" (Scale 1-10; or mild, moderate, severe)     Moderate 8. CAUSE: "What do you think is causing the rash?"     Used new soap 9. MEDICINE FACTORS: "Have you started any new medicines within the last 2 weeks?" (e.g., antibiotics)      No 10. OTHER SYMPTOMS: "Do you have any other symptoms?" (e.g., dizziness, headache, sore throat, joint pain)       Headache 11. PREGNANCY: "Is there any chance you are pregnant?" "When was your last menstrual period?"       No  Protocols used: Rash or Redness - [facility]

## 2021-09-06 NOTE — Telephone Encounter (Signed)
Can either of you see this patient?

## 2021-12-08 ENCOUNTER — Ambulatory Visit: Payer: 59 | Admitting: Internal Medicine

## 2022-08-04 ENCOUNTER — Ambulatory Visit (INDEPENDENT_AMBULATORY_CARE_PROVIDER_SITE_OTHER): Payer: PRIVATE HEALTH INSURANCE | Admitting: Family Medicine

## 2022-08-04 ENCOUNTER — Encounter: Payer: Self-pay | Admitting: Family Medicine

## 2022-08-04 VITALS — BP 116/78 | HR 88 | Temp 98.2°F | Ht 62.0 in | Wt 159.6 lb

## 2022-08-04 DIAGNOSIS — Z833 Family history of diabetes mellitus: Secondary | ICD-10-CM

## 2022-08-04 DIAGNOSIS — Z0000 Encounter for general adult medical examination without abnormal findings: Secondary | ICD-10-CM | POA: Diagnosis not present

## 2022-08-04 LAB — URINALYSIS, ROUTINE W REFLEX MICROSCOPIC
Bilirubin, UA: NEGATIVE
Glucose, UA: NEGATIVE
Ketones, UA: NEGATIVE
Nitrite, UA: NEGATIVE
Protein,UA: NEGATIVE
Specific Gravity, UA: 1.015 (ref 1.005–1.030)
Urobilinogen, Ur: 0.2 mg/dL (ref 0.2–1.0)
pH, UA: 7 (ref 5.0–7.5)

## 2022-08-04 LAB — MICROSCOPIC EXAMINATION

## 2022-08-04 LAB — BAYER DCA HB A1C WAIVED: HB A1C (BAYER DCA - WAIVED): 4.9 % (ref 4.8–5.6)

## 2022-08-04 NOTE — Progress Notes (Signed)
BP 116/78   Pulse 88   Temp 98.2 F (36.8 C)   Ht 5\' 2"  (1.575 m)   Wt 159 lb 9.6 oz (72.4 kg)   SpO2 99%   BMI 29.19 kg/m    Subjective:    Patient ID: Claudia Hanson, female    DOB: [birth year], 26 y.o.   MRN: [id number]  HPI: Claudia Hanson is a 26 y.o. female presenting on 08/04/2022 for comprehensive medical examination. Current medical complaints include:none  Menopausal Symptoms: no  Depression Screen done today and results listed below:     08/04/2022    8:10 AM 08/03/2021    8:08 AM 06/22/2020    2:09 PM 06/18/2019    3:39 PM 11/30/2018    3:34 PM  Depression screen PHQ 2/9  Decreased Interest 0 0 1 0 0  Down, Depressed, Hopeless 0 0 1 0 0  PHQ - 2 Score 0 0 2 0 0  Altered sleeping 0  0  0  Tired, decreased energy 1  1  1   Change in appetite 0  1  0  Feeling bad or failure about yourself  0  0  0  Trouble concentrating 0  0  0  Moving slowly or fidgety/restless 0  0  0  Suicidal thoughts 0  0  0  PHQ-9 Score 1  4  1   Difficult doing work/chores   Not difficult at all  Not difficult at all   Past Medical History:  Past Medical History:  Diagnosis Date   Gastritis    Generalized headaches    Vitamin D deficiency     Surgical History:  Past Surgical History:  Procedure Laterality Date   WISDOM TOOTH EXTRACTION      Medications:  Current Outpatient Medications on File Prior to Visit  Medication Sig   JUNEL FE 1.5/30 1.5-30 MG-MCG tablet Take 1 tablet by mouth daily.   No current facility-administered medications on file prior to visit.    Allergies:  No Known Allergies  Social History:  Social History   Socioeconomic History   Marital status: Single    Spouse name: Not on file   Number of children: 0   Years of education: Not on file   Highest education level: Not on file  Occupational History   Occupation: student  Tobacco Use   Smoking status: Never   Smokeless tobacco: Never  Vaping Use   Vaping Use: Never used  Substance and Sexual  Activity   Alcohol use: Yes    Alcohol/week: 0.0 standard drinks of alcohol    Comment: social   Drug use: No   Sexual activity: Not Currently  Other Topics Concern   Not on file  Social History Narrative   Not on file   Social Determinants of Health   Financial Resource Strain: Not on file  Food Insecurity: Not on file  Transportation Needs: Not on file  Physical Activity: Not on file  Stress: Not on file  Social Connections: Not on file  Intimate Partner Violence: Not on file   Social History   Tobacco Use  Smoking Status Never  Smokeless Tobacco Never   Social History   Substance and Sexual Activity  Alcohol Use Yes   Alcohol/week: 0.0 standard drinks of alcohol   Comment: social    Family History:  Family History  Problem Relation Age of Onset   Stroke Maternal Grandmother    Diabetes Paternal Grandmother    Heart disease Paternal Grandfather  MI   Colon cancer Neg Hx     Past medical history, surgical history, medications, allergies, family history and social history reviewed with patient today and changes made to appropriate areas of the chart.   Review of Systems  Constitutional: Negative.   HENT: Negative.    Eyes: Negative.   Respiratory: Negative.    Cardiovascular:  Positive for palpitations (with alcohol consumption). Negative for chest pain, orthopnea, claudication, leg swelling and PND.  Gastrointestinal: Negative.   Genitourinary: Negative.   Musculoskeletal: Negative.   Skin: Negative.   Neurological: Negative.   Endo/Heme/Allergies: Negative.   Psychiatric/Behavioral: Negative.     All other ROS negative except what is listed above and in the HPI.      Objective:    BP 116/78   Pulse 88   Temp 98.2 F (36.8 C)   Ht 5\' 2"  (1.575 m)   Wt 159 lb 9.6 oz (72.4 kg)   SpO2 99%   BMI 29.19 kg/m   Wt Readings from Last 3 Encounters:  08/04/22 159 lb 9.6 oz (72.4 kg)  09/06/21 152 lb (68.9 kg)  08/03/21 148 lb 3.2 oz (67.2 kg)     Physical Exam Vitals and nursing note reviewed.  Constitutional:      General: She is not in acute distress.    Appearance: Normal appearance. She is not ill-appearing, toxic-appearing or diaphoretic.  HENT:     Head: Normocephalic and atraumatic.     Right Ear: Tympanic membrane, ear canal and external ear normal. There is no impacted cerumen.     Left Ear: Tympanic membrane, ear canal and external ear normal. There is no impacted cerumen.     Nose: Nose normal. No congestion or rhinorrhea.     Mouth/Throat:     Mouth: Mucous membranes are moist.     Pharynx: Oropharynx is clear. No oropharyngeal exudate or posterior oropharyngeal erythema.  Eyes:     General: No scleral icterus.       Right eye: No discharge.        Left eye: No discharge.     Extraocular Movements: Extraocular movements intact.     Conjunctiva/sclera: Conjunctivae normal.     Pupils: Pupils are equal, round, and reactive to light.  Neck:     Vascular: No carotid bruit.  Cardiovascular:     Rate and Rhythm: Normal rate and regular rhythm.     Pulses: Normal pulses.     Heart sounds: No murmur heard.    No friction rub. No gallop.  Pulmonary:     Effort: Pulmonary effort is normal. No respiratory distress.     Breath sounds: Normal breath sounds. No stridor. No wheezing, rhonchi or rales.  Chest:     Chest wall: No tenderness.  Abdominal:     General: Abdomen is flat. Bowel sounds are normal. There is no distension.     Palpations: Abdomen is soft. There is no mass.     Tenderness: There is no abdominal tenderness. There is no right CVA tenderness, left CVA tenderness, guarding or rebound.     Hernia: No hernia is present.  Genitourinary:    Comments: Breast and pelvic exams deferred with shared decision making Musculoskeletal:        General: No swelling, tenderness, deformity or signs of injury.     Cervical back: Normal range of motion and neck supple. No rigidity. No muscular tenderness.     Right  lower leg: No edema.     Left lower  leg: No edema.  Lymphadenopathy:     Cervical: No cervical adenopathy.  Skin:    General: Skin is warm and dry.     Capillary Refill: Capillary refill takes less than 2 seconds.     Coloration: Skin is not jaundiced or pale.     Findings: No bruising, erythema, lesion or rash.  Neurological:     General: No focal deficit present.     Mental Status: She is alert and oriented to person, place, and time. Mental status is at baseline.     Cranial Nerves: No cranial nerve deficit.     Sensory: No sensory deficit.     Motor: No weakness.     Coordination: Coordination normal.     Gait: Gait normal.     Deep Tendon Reflexes: Reflexes normal.  Psychiatric:        Mood and Affect: Mood normal.        Behavior: Behavior normal.        Thought Content: Thought content normal.        Judgment: Judgment normal.     Results for orders placed or performed during the hospital encounter of 09/06/21  Comprehensive metabolic panel  Result Value Ref Range   Sodium 136 135 - 145 mmol/L   Potassium 4.2 3.5 - 5.1 mmol/L   Chloride 106 98 - 111 mmol/L   CO2 25 22 - 32 mmol/L   Glucose, Bld 90 70 - 99 mg/dL   BUN 11 6 - 20 mg/dL   Creatinine, Ser 0.67 0.44 - 1.00 mg/dL   Calcium 9.3 8.9 - 10.3 mg/dL   Total Protein 7.3 6.5 - 8.1 g/dL   Albumin 4.1 3.5 - 5.0 g/dL   AST 20 15 - 41 U/L   ALT 18 0 - 44 U/L   Alkaline Phosphatase 35 (L) 38 - 126 U/L   Total Bilirubin 0.5 0.3 - 1.2 mg/dL   GFR, Estimated >60 >60 mL/min   Anion gap 5 5 - 15  CBC with Differential  Result Value Ref Range   WBC 11.5 (H) 4.0 - 10.5 K/uL   RBC 4.18 3.87 - 5.11 MIL/uL   Hemoglobin 13.0 12.0 - 15.0 g/dL   HCT 39.0 36.0 - 46.0 %   MCV 93.3 80.0 - 100.0 fL   MCH 31.1 26.0 - 34.0 pg   MCHC 33.3 30.0 - 36.0 g/dL   RDW 13.0 11.5 - 15.5 %   Platelets 338 150 - 400 K/uL   nRBC 0.0 0.0 - 0.2 %   Neutrophils Relative % 62 %   Neutro Abs 7.0 1.7 - 7.7 K/uL   Lymphocytes Relative 28 %    Lymphs Abs 3.3 0.7 - 4.0 K/uL   Monocytes Relative 7 %   Monocytes Absolute 0.8 0.1 - 1.0 K/uL   Eosinophils Relative 3 %   Eosinophils Absolute 0.4 0.0 - 0.5 K/uL   Basophils Relative 0 %   Basophils Absolute 0.0 0.0 - 0.1 K/uL   Immature Granulocytes 0 %   Abs Immature Granulocytes 0.05 0.00 - 0.07 K/uL      Assessment & Plan:   Problem List Items Addressed This Visit   None Visit Diagnoses     Routine general medical examination at a health care facility    -  Primary   Vaccines up to date. Screening labs checked today. Pap up to date. Continue diet and exercise. Call with any concerns.    Relevant Orders   CBC with Differential/Platelet  Comprehensive metabolic panel   Lipid Panel w/o Chol/HDL Ratio   Urinalysis, Routine w reflex microscopic   TSH   VITAMIN D 25 Hydroxy (Vit-D Deficiency, Fractures)   Family history of diabetes mellitus       Labs drawn today. Await results.    Relevant Orders   Bayer DCA Hb A1c Waived        Follow up plan: Return in about 1 year (around 08/05/2023) for physical.   LABORATORY TESTING:  - Pap smear: up to date  IMMUNIZATIONS:   - Tdap: Tetanus vaccination status reviewed: last tetanus booster within 10 years. - Influenza: Postponed to flu season - Pneumovax: Not applicable - Prevnar: Not applicable - COVID: Refused - HPV: Up to date - Shingrix vaccine: Not applicable  PATIENT COUNSELING:   Advised to take 1 mg of folate supplement per day if capable of pregnancy.   Sexuality: Discussed sexually transmitted diseases, partner selection, use of condoms, avoidance of unintended pregnancy  and contraceptive alternatives.   Advised to avoid cigarette smoking.  I discussed with the patient that most people either abstain from alcohol or drink within safe limits (<=14/week and <=4 drinks/occasion for males, <=7/weeks and <= 3 drinks/occasion for females) and that the risk for alcohol disorders and other health effects rises  proportionally with the number of drinks per week and how often a drinker exceeds daily limits.  Discussed cessation/primary prevention of drug use and availability of treatment for abuse.   Diet: Encouraged to adjust caloric intake to maintain  or achieve ideal body weight, to reduce intake of dietary saturated fat and total fat, to limit sodium intake by avoiding high sodium foods and not adding table salt, and to maintain adequate dietary potassium and calcium preferably from fresh fruits, vegetables, and low-fat dairy products.    stressed the importance of regular exercise  Injury prevention: Discussed safety belts, safety helmets, smoke detector, smoking near bedding or upholstery.   Dental health: Discussed importance of regular tooth brushing, flossing, and dental visits.    NEXT PREVENTATIVE PHYSICAL DUE IN 1 YEAR. Return in about 1 year (around 08/05/2023) for physical.

## 2022-08-05 LAB — COMPREHENSIVE METABOLIC PANEL
ALT: 15 IU/L (ref 0–32)
AST: 16 IU/L (ref 0–40)
Albumin/Globulin Ratio: 1.9 (ref 1.2–2.2)
Albumin: 4.7 g/dL (ref 4.0–5.0)
Alkaline Phosphatase: 41 IU/L — ABNORMAL LOW (ref 44–121)
BUN/Creatinine Ratio: 15 (ref 9–23)
BUN: 11 mg/dL (ref 6–20)
Bilirubin Total: 0.4 mg/dL (ref 0.0–1.2)
CO2: 21 mmol/L (ref 20–29)
Calcium: 9 mg/dL (ref 8.7–10.2)
Chloride: 100 mmol/L (ref 96–106)
Creatinine, Ser: 0.73 mg/dL (ref 0.57–1.00)
Globulin, Total: 2.5 g/dL (ref 1.5–4.5)
Glucose: 76 mg/dL (ref 70–99)
Potassium: 4.4 mmol/L (ref 3.5–5.2)
Sodium: 136 mmol/L (ref 134–144)
Total Protein: 7.2 g/dL (ref 6.0–8.5)
eGFR: 116 mL/min/{1.73_m2} (ref >59)

## 2022-08-05 LAB — CBC WITH DIFFERENTIAL/PLATELET
Basophils Absolute: 0 10*3/uL (ref 0.0–0.2)
Basos: 0 %
EOS (ABSOLUTE): 0.2 10*3/uL (ref 0.0–0.4)
Eos: 2 %
Hematocrit: 39.7 % (ref 34.0–46.6)
Hemoglobin: 13.1 g/dL (ref 11.1–15.9)
Immature Grans (Abs): 0 10*3/uL (ref 0.0–0.1)
Immature Granulocytes: 0 %
Lymphocytes Absolute: 2.6 10*3/uL (ref 0.7–3.1)
Lymphs: 24 %
MCH: 30.5 pg (ref 26.6–33.0)
MCHC: 33 g/dL (ref 31.5–35.7)
MCV: 92 fL (ref 79–97)
Monocytes Absolute: 0.6 10*3/uL (ref 0.1–0.9)
Monocytes: 5 %
Neutrophils Absolute: 7.4 10*3/uL — ABNORMAL HIGH (ref 1.4–7.0)
Neutrophils: 69 %
Platelets: 299 10*3/uL (ref 150–450)
RBC: 4.3 x10E6/uL (ref 3.77–5.28)
RDW: 11.8 % (ref 11.7–15.4)
WBC: 10.9 10*3/uL — ABNORMAL HIGH (ref 3.4–10.8)

## 2022-08-05 LAB — TSH: TSH: 2.07 u[IU]/mL (ref 0.450–4.500)

## 2022-08-05 LAB — LIPID PANEL W/O CHOL/HDL RATIO
Cholesterol, Total: 181 mg/dL (ref 100–199)
HDL: 40 mg/dL (ref >39)
LDL Chol Calc (NIH): 124 mg/dL — ABNORMAL HIGH (ref 0–99)
Triglycerides: 92 mg/dL (ref 0–149)
VLDL Cholesterol Cal: 17 mg/dL (ref 5–40)

## 2022-08-05 LAB — VITAMIN D 25 HYDROXY (VIT D DEFICIENCY, FRACTURES): Vit D, 25-Hydroxy: 36.5 ng/mL (ref 30.0–100.0)

## 2022-12-16 ENCOUNTER — Encounter: Payer: Self-pay | Admitting: Family Medicine

## 2022-12-16 ENCOUNTER — Ambulatory Visit (INDEPENDENT_AMBULATORY_CARE_PROVIDER_SITE_OTHER): Payer: PRIVATE HEALTH INSURANCE | Admitting: Family Medicine

## 2022-12-16 VITALS — BP 123/79 | HR 83 | Temp 98.6°F | Ht 62.0 in | Wt 166.3 lb

## 2022-12-16 DIAGNOSIS — N750 Cyst of Bartholin's gland: Secondary | ICD-10-CM

## 2022-12-16 MED ORDER — DOXYCYCLINE HYCLATE 100 MG PO TABS: 100.0000 mg | ORAL_TABLET | Freq: Two times a day (BID) | ORAL | 0 refills | Status: DC

## 2022-12-16 NOTE — Progress Notes (Signed)
BP 123/79   Pulse 83   Temp 98.6 F (37 C) (Oral)   Ht 5\' 2"  (1.575 m)   Wt 166 lb 4.8 oz (75.4 kg)   SpO2 99%   BMI 30.42 kg/m    Subjective:    Patient ID: , female    DOB: 07/01/1996, 26 y.o.   MRN: [id number]  HPI: Claudia Hanson is a 26 y.o. female  Chief Complaint  Patient presents with   Cyst    Patient says she noticed a lump on the groin area. Patient says she has been apply warming compress. Patient says it has gone down a little. Patient denies having any pain to the area.    LUMP Duration: 2 weeks Location: R side of her groin Onset: sudden Painful: no Discomfort: no Status:  not changing Trauma: no Redness: no Bruising: no Recent infection: no Swollen lymph nodes: no Requesting removal: no History of cancer: no Family history of cancer: no History of the same: no Associated signs and symptoms: none  Relevant past medical, surgical, family and social history reviewed and updated as indicated. Interim medical history since our last visit reviewed. Allergies and medications reviewed and updated.  Review of Systems  Constitutional: Negative.   Respiratory: Negative.    Cardiovascular: Negative.   Gastrointestinal: Negative.   Musculoskeletal: Negative.   Neurological: Negative.   Psychiatric/Behavioral: Negative.      Per HPI unless specifically indicated above     Objective:    BP 123/79   Pulse 83   Temp 98.6 F (37 C) (Oral)   Ht 5\' 2"  (1.575 m)   Wt 166 lb 4.8 oz (75.4 kg)   SpO2 99%   BMI 30.42 kg/m   Wt Readings from Last 3 Encounters:  12/16/22 166 lb 4.8 oz (75.4 kg)  08/04/22 159 lb 9.6 oz (72.4 kg)  09/06/21 152 lb (68.9 kg)    Physical Exam Vitals and nursing note reviewed.  Constitutional:      General: She is not in acute distress.    Appearance: Normal appearance. She is well-developed.  HENT:     Head: Normocephalic and atraumatic.     Right Ear: Hearing and external ear normal.     Left Ear: Hearing  and external ear normal.     Nose: Nose normal.     Mouth/Throat:     Mouth: Mucous membranes are moist.     Pharynx: Oropharynx is clear.  Eyes:     General: Lids are normal. No scleral icterus.       Right eye: No discharge.        Left eye: No discharge.     Conjunctiva/sclera: Conjunctivae normal.  Pulmonary:     Effort: Pulmonary effort is normal. No respiratory distress.  Genitourinary:   Musculoskeletal:        General: Normal range of motion.  Skin:    Coloration: Skin is not jaundiced or pale.     Findings: No bruising, erythema, lesion or rash.  Neurological:     General: No focal deficit present.     Mental Status: She is alert and oriented to person, place, and time. Mental status is at baseline.  Psychiatric:        Mood and Affect: Mood normal.        Speech: Speech normal.        Behavior: Behavior normal.        Thought Content: Thought content normal.  Judgment: Judgment normal.     Results for orders placed or performed in visit on 12/01/22  HM PAP SMEAR  Result Value Ref Range   HM Pap smear see report scanned into chart.       Assessment & Plan:   Problem List Items Addressed This Visit   None Visit Diagnoses     Bartholin cyst    -  Primary   Will treat with doxycycline. Continue to monitor. Call with any concerns.        Follow up plan: Return if symptoms worsen or fail to improve.

## 2022-12-29 ENCOUNTER — Ambulatory Visit: Payer: PRIVATE HEALTH INSURANCE | Admitting: Family Medicine

## 2023-08-07 ENCOUNTER — Encounter: Payer: PRIVATE HEALTH INSURANCE | Admitting: Family Medicine

## 2023-08-17 ENCOUNTER — Encounter: Payer: Self-pay | Admitting: Family Medicine

## 2023-08-17 ENCOUNTER — Ambulatory Visit (INDEPENDENT_AMBULATORY_CARE_PROVIDER_SITE_OTHER): Payer: PRIVATE HEALTH INSURANCE | Admitting: Family Medicine

## 2023-08-17 VITALS — BP 134/85 | HR 90 | Temp 98.3°F | Ht 62.6 in | Wt 179.6 lb

## 2023-08-17 DIAGNOSIS — Z0000 Encounter for general adult medical examination without abnormal findings: Secondary | ICD-10-CM | POA: Diagnosis not present

## 2023-08-17 DIAGNOSIS — R1011 Right upper quadrant pain: Secondary | ICD-10-CM | POA: Diagnosis not present

## 2023-08-17 DIAGNOSIS — Z23 Encounter for immunization: Secondary | ICD-10-CM | POA: Diagnosis not present

## 2023-08-17 LAB — BAYER DCA HB A1C WAIVED: HB A1C (BAYER DCA - WAIVED): 5 % (ref 4.8–5.6)

## 2023-08-17 MED ORDER — MUPIROCIN 2 % EX OINT: 1.0000 | TOPICAL_OINTMENT | Freq: Two times a day (BID) | CUTANEOUS | 0 refills | Status: DC

## 2023-08-17 NOTE — Progress Notes (Signed)
BP 134/85   Pulse 90   Temp 98.3 F (36.8 C)   Ht 5' 2.6" (1.59 m)   Wt 179 lb 9.6 oz (81.5 kg)   BMI 32.22 kg/m    Subjective:    Patient ID: Claudia Hanson, female    DOB: June 10, 1996, 27 y.o.   MRN: [id number]  HPI: Claudia Hanson is a 27 y.o. female presenting on 08/17/2023 for comprehensive medical examination. Current medical complaints include: had some belly pain last week in [place]Q when she got back from [place]. Was also having diarrhea.    Menopausal Symptoms: no  Depression Screen done today and results listed below:     08/17/2023    3:36 PM 12/16/2022    9:00 AM 08/04/2022    8:10 AM 08/03/2021    8:08 AM 06/22/2020    2:09 PM  Depression screen PHQ 2/9  Decreased Interest 0 0 0 0 1  Down, Depressed, Hopeless 0 0 0 0 1  PHQ - 2 Score 0 0 0 0 2  Altered sleeping 0 0 0  0  Tired, decreased energy 0 1 1  1   Change in appetite 0 0 0  1  Feeling bad or failure about yourself  0 0 0  0  Trouble concentrating 0 0 0  0  Moving slowly or fidgety/restless 0 0 0  0  Suicidal thoughts 0 0 0  0  PHQ-9 Score 0 1 1  4   Difficult doing work/chores Not difficult at all Not difficult at all   Not difficult at all    Past Medical History:  Past Medical History:  Diagnosis Date   Gastritis    Generalized headaches    GERD (gastroesophageal reflux disease) 08/10/23   Vitamin D deficiency     Surgical History:  Past Surgical History:  Procedure Laterality Date   WISDOM TOOTH EXTRACTION      Medications:  Current Outpatient Medications on File Prior to Visit  Medication Sig   JUNEL FE 1.5/30 1.5-30 MG-MCG tablet Take 1 tablet by mouth daily.   No current facility-administered medications on file prior to visit.    Allergies:  No Known Allergies  Social History:  Social History   Socioeconomic History   Marital status: Single    Spouse name: Not on file   Number of children: 0   Years of education: Not on file   Highest education level: Not on file  Occupational  History   Occupation: student  Tobacco Use   Smoking status: Never   Smokeless tobacco: Never  Vaping Use   Vaping status: Never Used  Substance and Sexual Activity   Alcohol use: Yes    Alcohol/week: 1.0 - 2.0 standard drink of alcohol    Types: 1 - 2 Standard drinks or equivalent per week    Comment: social   Drug use: No   Sexual activity: Yes    Birth control/protection: Pill  Other Topics Concern   Not on file  Social History Narrative   Not on file   Social Determinants of Health   Financial Resource Strain: Not on file  Food Insecurity: Not on file  Transportation Needs: Not on file  Physical Activity: Not on file  Stress: Not on file  Social Connections: Not on file  Intimate Partner Violence: Not on file   Social History   Tobacco Use  Smoking Status Never  Smokeless Tobacco Never   Social History   Substance and Sexual Activity  Alcohol Use Yes  Alcohol/week: 1.0 - 2.0 standard drink of alcohol   Types: 1 - 2 Standard drinks or equivalent per week   Comment: social    Family History:  Family History  Problem Relation Age of Onset   Stroke Maternal Grandmother    Diabetes Paternal Grandmother    Stroke Paternal Grandmother    Heart disease Paternal Grandfather        MI   Cancer Maternal Aunt    Colon cancer Neg Hx     Past medical history, surgical history, medications, allergies, family history and social history reviewed with patient today and changes made to appropriate areas of the chart.   Review of Systems  Constitutional: Negative.   HENT: Negative.    Eyes: Negative.   Respiratory: Negative.    Cardiovascular: Negative.   Gastrointestinal:  Positive for abdominal pain and diarrhea. Negative for blood in stool, constipation, heartburn, melena, nausea and vomiting.  Genitourinary: Negative.   Musculoskeletal: Negative.  Negative for back pain, falls, joint pain, myalgias and neck pain.       Swelling at the back of her L heel- gone  now  Skin:  Positive for rash. Negative for itching.   All other ROS negative except what is listed above and in the HPI.      Objective:    BP 134/85   Pulse 90   Temp 98.3 F (36.8 C)   Ht 5' 2.6" (1.59 m)   Wt 179 lb 9.6 oz (81.5 kg)   BMI 32.22 kg/m   Wt Readings from Last 3 Encounters:  08/17/23 179 lb 9.6 oz (81.5 kg)  12/16/22 166 lb 4.8 oz (75.4 kg)  08/04/22 159 lb 9.6 oz (72.4 kg)    Physical Exam Vitals and nursing note reviewed.  Constitutional:      General: She is not in acute distress.    Appearance: Normal appearance. She is not ill-appearing, toxic-appearing or diaphoretic.  HENT:     Head: Normocephalic and atraumatic.     Right Ear: Tympanic membrane, ear canal and external ear normal. There is no impacted cerumen.     Left Ear: Tympanic membrane, ear canal and external ear normal. There is no impacted cerumen.     Nose: Nose normal. No congestion or rhinorrhea.     Mouth/Throat:     Mouth: Mucous membranes are moist.     Pharynx: Oropharynx is clear. No oropharyngeal exudate or posterior oropharyngeal erythema.  Eyes:     General: No scleral icterus.       Right eye: No discharge.        Left eye: No discharge.     Extraocular Movements: Extraocular movements intact.     Conjunctiva/sclera: Conjunctivae normal.     Pupils: Pupils are equal, round, and reactive to light.  Neck:     Vascular: No carotid bruit.  Cardiovascular:     Rate and Rhythm: Normal rate and regular rhythm.     Pulses: Normal pulses.     Heart sounds: No murmur heard.    No friction rub. No gallop.  Pulmonary:     Effort: Pulmonary effort is normal. No respiratory distress.     Breath sounds: Normal breath sounds. No stridor. No wheezing, rhonchi or rales.  Chest:     Chest wall: No tenderness.  Abdominal:     General: Abdomen is flat. Bowel sounds are normal. There is no distension.     Palpations: Abdomen is soft. There is no mass.  Tenderness: There is no abdominal  tenderness. There is no right CVA tenderness, left CVA tenderness, guarding or rebound.     Hernia: No hernia is present.  Genitourinary:    Comments: Breast and pelvic exams deferred with shared decision making Musculoskeletal:        General: No swelling, tenderness, deformity or signs of injury.     Cervical back: Normal range of motion and neck supple. No rigidity. No muscular tenderness.     Right lower leg: No edema.     Left lower leg: No edema.  Lymphadenopathy:     Cervical: No cervical adenopathy.  Skin:    General: Skin is warm and dry.     Capillary Refill: Capillary refill takes less than 2 seconds.     Coloration: Skin is not jaundiced or pale.     Findings: No bruising, erythema, lesion or rash.  Neurological:     General: No focal deficit present.     Mental Status: She is alert and oriented to person, place, and time. Mental status is at baseline.     Cranial Nerves: No cranial nerve deficit.     Sensory: No sensory deficit.     Motor: No weakness.     Coordination: Coordination normal.     Gait: Gait normal.     Deep Tendon Reflexes: Reflexes normal.  Psychiatric:        Mood and Affect: Mood normal.        Behavior: Behavior normal.        Thought Content: Thought content normal.        Judgment: Judgment normal.     Results for orders placed or performed in visit on 08/17/23  CBC with Differential/Platelet  Result Value Ref Range   WBC 10.9 (H) 3.4 - 10.8 x10E3/uL   RBC 4.19 3.77 - 5.28 x10E6/uL   Hemoglobin 13.0 11.1 - 15.9 g/dL   Hematocrit 40.9 81.1 - 46.6 %   MCV 92 79 - 97 fL   MCH 31.0 26.6 - 33.0 pg   MCHC 33.9 31.5 - 35.7 g/dL   RDW 91.4 78.2 - 95.6 %   Platelets 335 150 - 450 x10E3/uL   Neutrophils 67 Not Estab. %   Lymphs 25 Not Estab. %   Monocytes 6 Not Estab. %   Eos 1 Not Estab. %   Basos 0 Not Estab. %   Neutrophils Absolute 7.3 (H) 1.4 - 7.0 x10E3/uL   Lymphocytes Absolute 2.7 0.7 - 3.1 x10E3/uL   Monocytes Absolute 0.6 0.1 - 0.9  x10E3/uL   EOS (ABSOLUTE) 0.1 0.0 - 0.4 x10E3/uL   Basophils Absolute 0.0 0.0 - 0.2 x10E3/uL   Immature Granulocytes 1 Not Estab. %   Immature Grans (Abs) 0.1 0.0 - 0.1 x10E3/uL  Comprehensive metabolic panel  Result Value Ref Range   Glucose 107 (H) 70 - 99 mg/dL   BUN 8 6 - 20 mg/dL   Creatinine, Ser 2.13 0.57 - 1.00 mg/dL   eGFR 086 >57 QI/ONG/2.95   BUN/Creatinine Ratio 11 9 - 23   Sodium 135 134 - 144 mmol/L   Potassium 4.5 3.5 - 5.2 mmol/L   Chloride 100 96 - 106 mmol/L   CO2 18 (L) 20 - 29 mmol/L   Calcium 9.4 8.7 - 10.2 mg/dL   Total Protein 6.9 6.0 - 8.5 g/dL   Albumin 4.2 4.0 - 5.0 g/dL   Globulin, Total 2.7 1.5 - 4.5 g/dL   Bilirubin Total <2.8 0.0 - 1.2 mg/dL  Alkaline Phosphatase 53 44 - 121 IU/L   AST 25 0 - 40 IU/L   ALT 35 (H) 0 - 32 IU/L  Lipid Panel w/o Chol/HDL Ratio  Result Value Ref Range   Cholesterol, Total 209 (H) 100 - 199 mg/dL   Triglycerides 161 (H) 0 - 149 mg/dL   HDL 29 (L) >09 mg/dL   VLDL Cholesterol Cal 52 (H) 5 - 40 mg/dL   LDL Chol Calc (NIH) 604 (H) 0 - 99 mg/dL  TSH  Result Value Ref Range   TSH 1.480 0.450 - 4.500 uIU/mL  VITAMIN D 25 Hydroxy (Vit-D Deficiency, Fractures)  Result Value Ref Range   Vit D, 25-Hydroxy 18.1 (L) 30.0 - 100.0 ng/mL  Bayer DCA Hb A1c Waived  Result Value Ref Range   HB A1C (BAYER DCA - WAIVED) 5.0 4.8 - 5.6 %      Assessment & Plan:   Problem List Items Addressed This Visit   None Visit Diagnoses     Routine general medical examination at a health care facility    -  Primary   Vaccines updated. Screening labs checked today. Pap up to date and through GYN. Continue diet and exercise. Call with any concerns.   Relevant Orders   CBC with Differential/Platelet (Completed)   Comprehensive metabolic panel (Completed)   Lipid Panel w/o Chol/HDL Ratio (Completed)   TSH (Completed)   VITAMIN D 25 Hydroxy (Vit-D Deficiency, Fractures) (Completed)   Bayer DCA Hb A1c Waived (Completed)   Right upper  quadrant abdominal pain       Continue to monitor, if resolves won't worry- if continues will get RUQ [place].        Follow up plan: Return in about 1 year (around 08/16/2024) for physical.   LABORATORY TESTING:  - Pap smear: up to date  IMMUNIZATIONS:   - Tdap: Tetanus vaccination status reviewed: Tdap vaccination indicated and given today. - Influenza: Given elsewhere - Pneumovax: Not applicable - Prevnar: Up to date - COVID: Up to date - HPV: Up to date - Shingrix vaccine: Not applicable  PATIENT COUNSELING:   Advised to take 1 mg of folate supplement per day if capable of pregnancy.   Sexuality: Discussed sexually transmitted diseases, partner selection, use of condoms, avoidance of unintended pregnancy  and contraceptive alternatives.   Advised to avoid cigarette smoking.  I discussed with the patient that most people either abstain from alcohol or drink within safe limits (<=14/week and <=4 drinks/occasion for males, <=7/weeks and <= 3 drinks/occasion for females) and that the risk for alcohol disorders and other health effects rises proportionally with the number of drinks per week and how often a drinker exceeds daily limits.  Discussed cessation/primary prevention of drug use and availability of treatment for abuse.   Diet: Encouraged to adjust caloric intake to maintain  or achieve ideal body weight, to reduce intake of dietary saturated fat and total fat, to limit sodium intake by avoiding high sodium foods and not adding table salt, and to maintain adequate dietary potassium and calcium preferably from fresh fruits, vegetables, and low-fat dairy products.    stressed the importance of regular exercise  Injury prevention: Discussed safety belts, safety helmets, smoke detector, smoking near bedding or upholstery.   Dental health: Discussed importance of regular tooth brushing, flossing, and dental visits.    NEXT PREVENTATIVE PHYSICAL DUE IN 1 YEAR. Return in about 1  year (around 08/16/2024) for physical.

## 2023-08-18 LAB — LIPID PANEL W/O CHOL/HDL RATIO
Cholesterol, Total: 209 mg/dL — ABNORMAL HIGH (ref 100–199)
HDL: 29 mg/dL — ABNORMAL LOW (ref >39)
LDL Chol Calc (NIH): 128 mg/dL — ABNORMAL HIGH (ref 0–99)
Triglycerides: 293 mg/dL — ABNORMAL HIGH (ref 0–149)
VLDL Cholesterol Cal: 52 mg/dL — ABNORMAL HIGH (ref 5–40)

## 2023-08-18 LAB — CBC WITH DIFFERENTIAL/PLATELET
Basophils Absolute: 0 10*3/uL (ref 0.0–0.2)
Basos: 0 %
EOS (ABSOLUTE): 0.1 10*3/uL (ref 0.0–0.4)
Eos: 1 %
Hematocrit: 38.4 % (ref 34.0–46.6)
Hemoglobin: 13 g/dL (ref 11.1–15.9)
Immature Grans (Abs): 0.1 10*3/uL (ref 0.0–0.1)
Immature Granulocytes: 1 %
Lymphocytes Absolute: 2.7 10*3/uL (ref 0.7–3.1)
Lymphs: 25 %
MCH: 31 pg (ref 26.6–33.0)
MCHC: 33.9 g/dL (ref 31.5–35.7)
MCV: 92 fL (ref 79–97)
Monocytes Absolute: 0.6 10*3/uL (ref 0.1–0.9)
Monocytes: 6 %
Neutrophils Absolute: 7.3 10*3/uL — ABNORMAL HIGH (ref 1.4–7.0)
Neutrophils: 67 %
Platelets: 335 10*3/uL (ref 150–450)
RBC: 4.19 x10E6/uL (ref 3.77–5.28)
RDW: 12.1 % (ref 11.7–15.4)
WBC: 10.9 10*3/uL — ABNORMAL HIGH (ref 3.4–10.8)

## 2023-08-18 LAB — VITAMIN D 25 HYDROXY (VIT D DEFICIENCY, FRACTURES): Vit D, 25-Hydroxy: 18.1 ng/mL — ABNORMAL LOW (ref 30.0–100.0)

## 2023-08-18 LAB — COMPREHENSIVE METABOLIC PANEL
ALT: 35 IU/L — ABNORMAL HIGH (ref 0–32)
AST: 25 IU/L (ref 0–40)
Albumin: 4.2 g/dL (ref 4.0–5.0)
Alkaline Phosphatase: 53 IU/L (ref 44–121)
BUN/Creatinine Ratio: 11 (ref 9–23)
BUN: 8 mg/dL (ref 6–20)
Bilirubin Total: <0.2 mg/dL (ref 0.0–1.2)
CO2: 18 mmol/L — ABNORMAL LOW (ref 20–29)
Calcium: 9.4 mg/dL (ref 8.7–10.2)
Chloride: 100 mmol/L (ref 96–106)
Creatinine, Ser: 0.7 mg/dL (ref 0.57–1.00)
Globulin, Total: 2.7 g/dL (ref 1.5–4.5)
Glucose: 107 mg/dL — ABNORMAL HIGH (ref 70–99)
Potassium: 4.5 mmol/L (ref 3.5–5.2)
Sodium: 135 mmol/L (ref 134–144)
Total Protein: 6.9 g/dL (ref 6.0–8.5)
eGFR: 121 mL/min/{1.73_m2} (ref >59)

## 2023-08-18 LAB — TSH: TSH: 1.48 u[IU]/mL (ref 0.450–4.500)

## 2023-08-20 ENCOUNTER — Other Ambulatory Visit: Payer: Self-pay | Admitting: Family Medicine

## 2023-08-20 MED ORDER — VITAMIN D (ERGOCALCIFEROL) 1.25 MG (50000 UNIT) PO CAPS: 50000.0000 [IU] | ORAL_CAPSULE | ORAL | 1 refills | Status: DC

## 2024-01-28 ENCOUNTER — Encounter: Payer: Self-pay | Admitting: Emergency Medicine

## 2024-01-28 ENCOUNTER — Ambulatory Visit
Admission: EM | Admit: 2024-01-28 | Discharge: 2024-01-28 | Disposition: A | Discharge status: Home / Self Care | Payer: Medicaid Other

## 2024-01-28 DIAGNOSIS — J101 Influenza due to other identified influenza virus with other respiratory manifestations: Secondary | ICD-10-CM | POA: Diagnosis not present

## 2024-01-28 LAB — POCT INFLUENZA A/B
Influenza A, POC: POSITIVE — AB
Influenza B, POC: NEGATIVE

## 2024-01-28 MED ORDER — OSELTAMIVIR PHOSPHATE 75 MG PO CAPS: 75.0000 mg | ORAL_CAPSULE | Freq: Two times a day (BID) | ORAL | 0 refills | Status: DC

## 2024-01-28 NOTE — ED Triage Notes (Signed)
Pt presents with a cough, runny nose, chills and fatigue since yesterday.

## 2024-01-28 NOTE — ED Provider Notes (Signed)
Renaldo Fiddler    CSN: [id number] Arrival date & time: 01/28/24  1332      History   Chief Complaint Chief Complaint  Patient presents with   Fever   Chills   Nasal Congestion   Fatigue   Cough    HPI Claudia Hanson is a 28 y.o. female.   Patient presents for evaluation of fever, chills, nasal congestion, nonproductive cough, sore throat, diarrhea and headache present for 1 day.  Possible sick contacts at work with exposure to influenza.  Tolerating food and liquids.  Has attempted use of Mucinex.  Denies shortness of breath or wheezing.  Past Medical History:  Diagnosis Date   Gastritis    Generalized headaches    GERD (gastroesophageal reflux disease) 08/10/23   Vitamin D deficiency     Patient Active Problem List   Diagnosis Date Noted   Vitamin D deficiency 04/01/2016   Allergic rhinitis 12/07/2015    Past Surgical History:  Procedure Laterality Date   WISDOM TOOTH EXTRACTION      OB History   No obstetric history on file.      Home Medications    Prior to Admission medications   Medication Sig Start Date End Date Taking? Authorizing Provider  oseltamivir (TAMIFLU) 75 MG capsule Take 1 capsule (75 mg total) by mouth every 12 (twelve) hours. 01/28/24  Yes Tarvares Lant R, NP  AUROVELA 1.5/30 1.5-30 MG-MCG tablet Take by mouth.    [provider]  mupirocin ointment (BACTROBAN) 2 % Apply 1 Application topically 2 (two) times daily. 08/17/23   Olevia Perches P, DO  Vitamin D, Ergocalciferol, (DRISDOL) 1.25 MG (50000 UNIT) CAPS capsule Take 1 capsule (50,000 Units total) by mouth every 7 (seven) days. 08/20/23   Dorcas Carrow, DO    Family History Family History  Problem Relation Age of Onset   Stroke Maternal Grandmother    Diabetes Paternal Grandmother    Stroke Paternal Grandmother    Heart disease Paternal Grandfather        MI   Cancer Maternal Aunt    Colon cancer Neg Hx     Social History Social History   Tobacco Use    Smoking status: Never   Smokeless tobacco: Never  Vaping Use   Vaping status: Never Used  Substance Use Topics   Alcohol use: Yes    Alcohol/week: 1.0 - 2.0 standard drink of alcohol    Types: 1 - 2 Standard drinks or equivalent per week    Comment: social   Drug use: No     Allergies   Patient has no known allergies.   Review of Systems Review of Systems   Physical Exam Triage Vital Signs ED Triage Vitals  Encounter Vitals Group     BP 01/28/24 1451 134/87     Systolic BP Percentile --      Diastolic BP Percentile --      Pulse Rate 01/28/24 1451 (!) 122     Resp 01/28/24 1451 18     Temp 01/28/24 1451 (!) 101.5 F (38.6 C)     Temp Source 01/28/24 1451 Oral     SpO2 01/28/24 1451 97 %     Weight --      Height --      Head Circumference --      Peak Flow --      Pain Score 01/28/24 1448 0     Pain Loc --      Pain Education --  Exclude from Growth Chart --    No data found.  Updated Vital Signs BP 134/87 (BP Location: Left Arm)   Pulse (!) 122   Temp (!) 101.5 F (38.6 C) (Oral)   Resp 18   SpO2 97%   Visual Acuity Right Eye Distance:   Left Eye Distance:   Bilateral Distance:    Right Eye Near:   Left Eye Near:    Bilateral Near:     Physical Exam Constitutional:      Appearance: Normal appearance.  HENT:     Right Ear: Tympanic membrane, ear canal and external ear normal.     Left Ear: Tympanic membrane, ear canal and external ear normal.     Nose: Congestion and rhinorrhea present.     Mouth/Throat:     Pharynx: No oropharyngeal exudate or posterior oropharyngeal erythema.  Eyes:     Extraocular Movements: Extraocular movements intact.  Cardiovascular:     Rate and Rhythm: Normal rate and regular rhythm.     Pulses: Normal pulses.     Heart sounds: Normal heart sounds.  Pulmonary:     Effort: Pulmonary effort is normal.     Breath sounds: Normal breath sounds.  Musculoskeletal:     Cervical back: Normal range of motion and neck  supple.  Neurological:     General: No focal deficit present.     Mental Status: She is alert and oriented to person, place, and time. Mental status is at baseline.      UC Treatments / Results  Labs (all labs ordered are listed, but only abnormal results are displayed) Labs Reviewed  POCT INFLUENZA A/B - Abnormal; Notable for the following components:      Result Value   Influenza A, POC Positive (*)    All other components within normal limits    EKG   Radiology No results found.  Procedures Procedures (including critical care time)  Medications Ordered in UC Medications - No data to display  Initial Impression / Assessment and Plan / UC Course  I have reviewed the triage vital signs and the nursing notes.  Pertinent labs & imaging results that were available during my care of the patient were reviewed by me and considered in my medical decision making (see chart for details).  Influenza A  Patient is in no signs of distress nor toxic appearing.  Vital signs are stable.  Low suspicion for pneumonia, pneumothorax or bronchitis and therefore will defer imaging.prescribed tamiflu. May use additional over-the-counter medications as needed for supportive care.  May follow-up with urgent care as needed if symptoms persist or worsen. Note given   Final Clinical Impressions(s) / UC Diagnoses   Final diagnoses:  Influenza A     Discharge Instructions      Influenza A is a virus and should steadily improve in time it can take up to 7 to 10 days before you truly start to see a turnaround however things will get better  Begin Tamiflu every morning and every evening for 5 days to reduce the amount of virus in the body which helps to minimize symptoms    You can take Tylenol 500 to 1000 mg every 6 hours and/or Ibuprofen 600 to 800 mg every 6-8 hours as needed for fever reduction and pain relief.   For cough: honey 1/2 to 1 teaspoon (you can dilute the honey in water or  another fluid).  You can also use guaifenesin and dextromethorphan for cough. You can use a  humidifier for chest congestion and cough.  If you don't have a humidifier, you can sit in the bathroom with the hot shower running.      For sore throat: try warm salt water gargles, cepacol lozenges, throat spray, warm tea or water with lemon/honey, popsicles or ice, or OTC cold relief medicine for throat discomfort.   For congestion: take a daily anti-histamine like Zyrtec, Claritin, and a oral decongestant, such as pseudoephedrine.  You can also use Flonase 1-2 sprays in each nostril daily.   It is important to stay hydrated: drink plenty of fluids (water, gatorade/powerade/pedialyte, juices, or teas) to keep your throat moisturized and help further relieve irritation/discomfort.    ED Prescriptions     Medication Sig Dispense Auth. Provider   oseltamivir (TAMIFLU) 75 MG capsule Take 1 capsule (75 mg total) by mouth every 12 (twelve) hours. 10 capsule Valinda Hoar, NP      PDMP not reviewed this encounter.   Valinda Hoar, NP 01/28/24 [phone]

## 2024-01-28 NOTE — Discharge Instructions (Addendum)
Influenza A is a virus and should steadily improve in time it can take up to 7 to 10 days before you truly start to see a turnaround however things will get better  Begin Tamiflu every morning and every evening for 5 days to reduce the amount of virus in the body which helps to minimize symptoms    You can take Tylenol 500 to 1000 mg every 6 hours and/or Ibuprofen 600 to 800 mg every 6-8 hours as needed for fever reduction and pain relief.   For cough: honey 1/2 to 1 teaspoon (you can dilute the honey in water or another fluid).  You can also use guaifenesin and dextromethorphan for cough. You can use a humidifier for chest congestion and cough.  If you don't have a humidifier, you can sit in the bathroom with the hot shower running.      For sore throat: try warm salt water gargles, cepacol lozenges, throat spray, warm tea or water with lemon/honey, popsicles or ice, or OTC cold relief medicine for throat discomfort.   For congestion: take a daily anti-histamine like Zyrtec, Claritin, and a oral decongestant, such as pseudoephedrine.  You can also use Flonase 1-2 sprays in each nostril daily.   It is important to stay hydrated: drink plenty of fluids (water, gatorade/powerade/pedialyte, juices, or teas) to keep your throat moisturized and help further relieve irritation/discomfort.

## 2024-05-24 ENCOUNTER — Other Ambulatory Visit: Payer: Self-pay | Admitting: Family Medicine

## 2024-05-24 NOTE — Telephone Encounter (Signed)
 Requested medication (s) are due for refill today: review  Requested medication (s) are on the active medication list: yes  Last refill:  08/20/23 #12/1  Future visit scheduled: yes  Notes to clinic:  Unable to refill per protocol, cannot delegate.      Requested Prescriptions  Pending Prescriptions Disp Refills   Vitamin D , Ergocalciferol , (DRISDOL ) 1.25 MG (50000 UNIT) CAPS capsule [Pharmacy Med Name: VITAMIN D2 1.25MG (50,000 UNIT)] 4 capsule 2    Sig: Take 1 capsule (50,000 Units total) by mouth every 7 (seven) days.     Endocrinology:  Vitamins - Vitamin D  Supplementation 2 Failed - 05/24/2024  4:51 PM      Failed - Manual Review: Route requests for 50,000 IU strength to the provider      Failed - Vitamin D  in normal range and within 360 days    Vit D, 25-Hydroxy  Date Value Ref Range Status  08/17/2023 18.1 (L) 30.0 - 100.0 ng/mL Final    Comment:    Vitamin D  deficiency has been defined by the Institute of Medicine and an Endocrine Society practice guideline as a level of serum 25-OH vitamin D  less than 20 ng/mL (1,2). The Endocrine Society went on to further define vitamin D  insufficiency as a level between 21 and 29 ng/mL (2). 1. IOM (Institute of Medicine). 2010. Dietary reference    intakes for calcium and D. [place]: The    [employer]. 2. Holick MF, [place], Bischoff-Ferrari HA, et al.    Evaluation, treatment, and prevention of vitamin D     deficiency: an Endocrine Society clinical practice    guideline. JCEM. 2011 Jul; 96(7):1911-30.          Failed - Valid encounter within last 12 months    Recent Outpatient Visits   None     Future Appointments             In 2 months Johnson, Megan P, DO [place] [facility]            Passed - Ca in normal range and within 360 days    Calcium  Date Value Ref Range Status  08/17/2023 9.4 8.7 - 10.2 mg/dL Final

## 2024-08-19 ENCOUNTER — Ambulatory Visit: Payer: Self-pay | Admitting: Family Medicine

## 2024-08-20 ENCOUNTER — Encounter: Payer: Self-pay | Admitting: Family Medicine

## 2024-08-20 ENCOUNTER — Ambulatory Visit: Admitting: Family Medicine

## 2024-08-20 VITALS — BP 121/71 | HR 85 | Temp 97.7°F | Ht 62.5 in | Wt 176.0 lb

## 2024-08-20 DIAGNOSIS — E559 Vitamin D deficiency, unspecified: Secondary | ICD-10-CM | POA: Diagnosis not present

## 2024-08-20 DIAGNOSIS — L680 Hirsutism: Secondary | ICD-10-CM | POA: Diagnosis not present

## 2024-08-20 DIAGNOSIS — Z0000 Encounter for general adult medical examination without abnormal findings: Secondary | ICD-10-CM | POA: Diagnosis not present

## 2024-08-20 LAB — BAYER DCA HB A1C WAIVED: HB A1C (BAYER DCA - WAIVED): 5 % (ref 4.8–5.6)

## 2024-08-20 MED ORDER — AUROVELA 1.5/30 1.5-30 MG-MCG PO TABS: 1.0000 | ORAL_TABLET | Freq: Every day | ORAL | 4 refills | Status: AC

## 2024-08-20 NOTE — Assessment & Plan Note (Signed)
 Rechecking labs today. Await results. Treat as needed.

## 2024-08-20 NOTE — Progress Notes (Signed)
 BP 121/71   Pulse 85   Temp 97.7 F (36.5 C) (Oral)   Ht 5' 2.5 (1.588 m)   Wt 176 lb (79.8 kg)   SpO2 97%   BMI 31.68 kg/m    Subjective:    Patient ID: Claudia Hanson, female    DOB: 1996/09/20, 28 y.o.   MRN: [id number]  HPI: Claudia Hanson is a 28 y.o. female presenting on 08/20/2024 for comprehensive medical examination. Current medical complaints include:none   Menopausal Symptoms: no  Depression Screen done today and results listed below:     08/20/2024    8:44 AM 08/17/2023    3:36 PM 12/16/2022    9:00 AM 08/04/2022    8:10 AM 08/03/2021    8:08 AM  Depression screen PHQ 2/9  Decreased Interest 0 0 0 0 0  Down, Depressed, Hopeless 0 0 0 0 0  PHQ - 2 Score 0 0 0 0 0  Altered sleeping 0 0 0 0   Tired, decreased energy 0 0 1 1   Change in appetite 0 0 0 0   Feeling bad or failure about yourself  0 0 0 0   Trouble concentrating 0 0 0 0   Moving slowly or fidgety/restless 0 0 0 0   Suicidal thoughts 0 0 0 0   PHQ-9 Score 0 0 1 1   Difficult doing work/chores Not difficult at all Not difficult at all Not difficult at all      Past Medical History:  Past Medical History:  Diagnosis Date   Gastritis    Generalized headaches    GERD (gastroesophageal reflux disease) 08/10/23   Vitamin D  deficiency     Surgical History:  Past Surgical History:  Procedure Laterality Date   WISDOM TOOTH EXTRACTION      Medications:  Current Outpatient Medications on File Prior to Visit  Medication Sig   Vitamin D , Ergocalciferol , (DRISDOL ) 1.25 MG (50000 UNIT) CAPS capsule TAKE 1 CAPSULE (50,000 UNITS TOTAL) BY MOUTH EVERY 7 (SEVEN) DAYS   No current facility-administered medications on file prior to visit.    Allergies:  No Known Allergies  Social History:  Social History   Socioeconomic History   Marital status: Single    Spouse name: Not on file   Number of children: 0   Years of education: Not on file   Highest education level: Master's degree (e.g., MA, MS,  MEng, MEd, MSW, MBA)  Occupational History   Occupation: student  Tobacco Use   Smoking status: Never   Smokeless tobacco: Never  Vaping Use   Vaping status: Never Used  Substance and Sexual Activity   Alcohol use: Yes    Alcohol/week: 1.0 - 2.0 standard drink of alcohol    Types: 1 - 2 Standard drinks or equivalent per week    Comment: social   Drug use: No   Sexual activity: Yes    Birth control/protection: Pill  Other Topics Concern   Not on file  Social History Narrative   Not on file   Social Drivers of Health   Financial Resource Strain: Low Risk  (08/19/2024)   Overall Financial Resource Strain (CARDIA)    Difficulty of Paying Living Expenses: Not very hard  Food Insecurity: No Food Insecurity (08/19/2024)   Hunger Vital Sign    Worried About Running Out of Food in the Last Year: Never true    Ran Out of Food in the Last Year: Never true  Transportation Needs: No Transportation Needs (08/19/2024)  PRAPARE - Administrator, Civil Service (Medical): No    Lack of Transportation (Non-Medical): No  Physical Activity: Insufficiently Active (08/19/2024)   Exercise Vital Sign    Days of Exercise per Week: 3 days    Minutes of Exercise per Session: 30 min  Stress: No Stress Concern Present (08/19/2024)   [employer] of Occupational Health - Occupational Stress Questionnaire    Feeling of Stress: Only a little  Social Connections: Moderately Isolated (08/19/2024)   Social Connection and Isolation Panel    Frequency of Communication with Friends and Family: Three times a week    Frequency of Social Gatherings with Friends and Family: Once a week    Attends Religious Services: More than 4 times per year    Active Member of [employer] or Organizations: No    Attends Engineer, structural: Not on file    Marital Status: Never married  Intimate Partner Violence: Not At Risk (08/20/2024)   Humiliation, Afraid, Rape, and Kick questionnaire    Fear of Current or  Ex-Partner: No    Emotionally Abused: No    Physically Abused: No    Sexually Abused: No   Social History   Tobacco Use  Smoking Status Never  Smokeless Tobacco Never   Social History   Substance and Sexual Activity  Alcohol Use Yes   Alcohol/week: 1.0 - 2.0 standard drink of alcohol   Types: 1 - 2 Standard drinks or equivalent per week   Comment: social    Family History:  Family History  Problem Relation Age of Onset   Stroke Maternal Grandmother    Diabetes Paternal Grandmother    Stroke Paternal Grandmother    Heart disease Paternal Grandfather        MI   Cancer Maternal Aunt    Colon cancer Neg Hx     Past medical history, surgical history, medications, allergies, family history and social history reviewed with patient today and changes made to appropriate areas of the chart.   Review of Systems  Constitutional: Negative.   HENT: Negative.    Eyes:  Positive for blurred vision. Negative for double vision, photophobia, pain, discharge and redness.  Respiratory: Negative.    Cardiovascular: Negative.   Gastrointestinal: Negative.   Genitourinary: Negative.   Musculoskeletal:  Positive for myalgias. Negative for back pain, falls, joint pain and neck pain.  Skin: Negative.   Neurological:  Positive for dizziness (for a second occasionally with walking). Negative for tingling, tremors, sensory change, speech change, focal weakness, seizures, loss of consciousness, weakness and headaches.  Endo/Heme/Allergies: Negative.   Psychiatric/Behavioral: Negative.     All other ROS negative except what is listed above and in the HPI.      Objective:    BP 121/71   Pulse 85   Temp 97.7 F (36.5 C) (Oral)   Ht 5' 2.5 (1.588 m)   Wt 176 lb (79.8 kg)   SpO2 97%   BMI 31.68 kg/m   Wt Readings from Last 3 Encounters:  08/20/24 176 lb (79.8 kg)  08/17/23 179 lb 9.6 oz (81.5 kg)  12/16/22 166 lb 4.8 oz (75.4 kg)    Physical Exam Vitals and nursing note reviewed.   Constitutional:      General: She is not in acute distress.    Appearance: Normal appearance. She is not ill-appearing, toxic-appearing or diaphoretic.  HENT:     Head: Normocephalic and atraumatic.     Right Ear: Tympanic membrane, ear canal and  external ear normal. There is no impacted cerumen.     Left Ear: Tympanic membrane, ear canal and external ear normal. There is no impacted cerumen.     Nose: Nose normal. No congestion or rhinorrhea.     Mouth/Throat:     Mouth: Mucous membranes are moist.     Pharynx: Oropharynx is clear. No oropharyngeal exudate or posterior oropharyngeal erythema.  Eyes:     General: No scleral icterus.       Right eye: No discharge.        Left eye: No discharge.     Extraocular Movements: Extraocular movements intact.     Conjunctiva/sclera: Conjunctivae normal.     Pupils: Pupils are equal, round, and reactive to light.  Neck:     Vascular: No carotid bruit.  Cardiovascular:     Rate and Rhythm: Normal rate and regular rhythm.     Pulses: Normal pulses.     Heart sounds: No murmur heard.    No friction rub. No gallop.  Pulmonary:     Effort: Pulmonary effort is normal. No respiratory distress.     Breath sounds: Normal breath sounds. No stridor. No wheezing, rhonchi or rales.  Chest:     Chest wall: No tenderness.  Abdominal:     General: Abdomen is flat. Bowel sounds are normal. There is no distension.     Palpations: Abdomen is soft. There is no mass.     Tenderness: There is no abdominal tenderness. There is no right CVA tenderness, left CVA tenderness, guarding or rebound.     Hernia: No hernia is present.  Genitourinary:    Comments: Breast and pelvic exams deferred with shared decision making Musculoskeletal:        General: No swelling, tenderness, deformity or signs of injury.     Cervical back: Normal range of motion and neck supple. No rigidity. No muscular tenderness.     Right lower leg: No edema.     Left lower leg: No edema.   Lymphadenopathy:     Cervical: No cervical adenopathy.  Skin:    General: Skin is warm and dry.     Capillary Refill: Capillary refill takes less than 2 seconds.     Coloration: Skin is not jaundiced or pale.     Findings: No bruising, erythema, lesion or rash.  Neurological:     General: No focal deficit present.     Mental Status: She is alert and oriented to person, place, and time. Mental status is at baseline.     Cranial Nerves: No cranial nerve deficit.     Sensory: No sensory deficit.     Motor: No weakness.     Coordination: Coordination normal.     Gait: Gait normal.     Deep Tendon Reflexes: Reflexes normal.  Psychiatric:        Mood and Affect: Mood normal.        Behavior: Behavior normal.        Thought Content: Thought content normal.        Judgment: Judgment normal.     Results for orders placed or performed during the hospital encounter of 01/28/24  POCT Influenza A/B   Collection Time: 01/28/24  3:10 PM  Result Value Ref Range   Influenza A, POC Positive (A)    Influenza B, POC Negative       Assessment & Plan:   Problem List Items Addressed This Visit       Other  Vitamin D  deficiency   Rechecking labs today. Await results. Treat as needed.       Relevant Orders   VITAMIN D  25 Hydroxy (Vit-D Deficiency, Fractures)   Other Visit Diagnoses       Routine general medical examination at a health care facility    -  Primary   Vaccines up to date. Screening labs checked today. Pap through GYN. Continue diet and exercsise. Call with any concerns.   Relevant Orders   Bayer DCA Hb A1c Waived   CBC with Differential/Platelet   Comprehensive metabolic panel with GFR   Lipid Panel w/o Chol/HDL Ratio   TSH     Hirsutism       Labs drawn today. Await results.   Relevant Orders   Testosterone , free, total([facility])   FSH/LH   Estradiol    DHEA-sulfate   Prolactin        Follow up plan: Return in about 1 year (around 08/20/2025) for  physical.   LABORATORY TESTING:  - Pap smear: done elsewhere  IMMUNIZATIONS:   - Tdap: Tetanus vaccination status reviewed: last tetanus booster within 10 years. - Influenza: Postponed to flu season - Pneumovax: Not applicable - Prevnar: Not applicable - COVID: Refused - HPV: Up to date  PATIENT COUNSELING:   Advised to take 1 mg of folate supplement per day if capable of pregnancy.   Sexuality: Discussed sexually transmitted diseases, partner selection, use of condoms, avoidance of unintended pregnancy  and contraceptive alternatives.   Advised to avoid cigarette smoking.  I discussed with the patient that most people either abstain from alcohol or drink within safe limits (<=14/week and <=4 drinks/occasion for males, <=7/weeks and <= 3 drinks/occasion for females) and that the risk for alcohol disorders and other health effects rises proportionally with the number of drinks per week and how often a drinker exceeds daily limits.  Discussed cessation/primary prevention of drug use and availability of treatment for abuse.   Diet: Encouraged to adjust caloric intake to maintain  or achieve ideal body weight, to reduce intake of dietary saturated fat and total fat, to limit sodium intake by avoiding high sodium foods and not adding table salt, and to maintain adequate dietary potassium and calcium preferably from fresh fruits, vegetables, and low-fat dairy products.    stressed the importance of regular exercise  Injury prevention: Discussed safety belts, safety helmets, smoke detector, smoking near bedding or upholstery.   Dental health: Discussed importance of regular tooth brushing, flossing, and dental visits.    NEXT PREVENTATIVE PHYSICAL DUE IN 1 YEAR. Return in about 1 year (around 08/20/2025) for physical.

## 2024-08-22 ENCOUNTER — Ambulatory Visit: Payer: Self-pay | Admitting: Family Medicine

## 2024-08-22 DIAGNOSIS — R7989 Other specified abnormal findings of blood chemistry: Secondary | ICD-10-CM

## 2024-08-22 NOTE — Progress Notes (Signed)
 Called patient to schedule labs in 2 weeks. She said she will call back after work to schedule.

## 2024-08-23 NOTE — Progress Notes (Signed)
 Scheduled

## 2024-08-24 LAB — COMPREHENSIVE METABOLIC PANEL WITH GFR
ALT: 16 IU/L (ref 0–32)
AST: 14 IU/L (ref 0–40)
Albumin: 4.5 g/dL (ref 4.0–5.0)
Alkaline Phosphatase: 51 IU/L (ref 44–121)
BUN/Creatinine Ratio: 11 (ref 9–23)
BUN: 9 mg/dL (ref 6–20)
Bilirubin Total: 0.5 mg/dL (ref 0.0–1.2)
CO2: 22 mmol/L (ref 20–29)
Calcium: 9.3 mg/dL (ref 8.7–10.2)
Chloride: 101 mmol/L (ref 96–106)
Creatinine, Ser: 0.81 mg/dL (ref 0.57–1.00)
Globulin, Total: 2.4 g/dL (ref 1.5–4.5)
Glucose: 80 mg/dL (ref 70–99)
Potassium: 4.4 mmol/L (ref 3.5–5.2)
Sodium: 136 mmol/L (ref 134–144)
Total Protein: 6.9 g/dL (ref 6.0–8.5)
eGFR: 101 mL/min/1.73 (ref >59)

## 2024-08-24 LAB — LIPID PANEL W/O CHOL/HDL RATIO
Cholesterol, Total: 195 mg/dL (ref 100–199)
HDL: 30 mg/dL — ABNORMAL LOW (ref >39)
LDL Chol Calc (NIH): 142 mg/dL — ABNORMAL HIGH (ref 0–99)
Triglycerides: 124 mg/dL (ref 0–149)
VLDL Cholesterol Cal: 23 mg/dL (ref 5–40)

## 2024-08-24 LAB — CBC WITH DIFFERENTIAL/PLATELET
Basophils Absolute: 0.1 x10E3/uL (ref 0.0–0.2)
Basos: 1 %
EOS (ABSOLUTE): 0.2 x10E3/uL (ref 0.0–0.4)
Eos: 1 %
Hematocrit: 42.4 % (ref 34.0–46.6)
Hemoglobin: 13.7 g/dL (ref 11.1–15.9)
Immature Grans (Abs): 0 x10E3/uL (ref 0.0–0.1)
Immature Granulocytes: 0 %
Lymphocytes Absolute: 2.9 x10E3/uL (ref 0.7–3.1)
Lymphs: 23 %
MCH: 30.6 pg (ref 26.6–33.0)
MCHC: 32.3 g/dL (ref 31.5–35.7)
MCV: 95 fL (ref 79–97)
Monocytes Absolute: 0.6 x10E3/uL (ref 0.1–0.9)
Monocytes: 5 %
Neutrophils Absolute: 8.6 x10E3/uL — ABNORMAL HIGH (ref 1.4–7.0)
Neutrophils: 70 %
Platelets: 328 x10E3/uL (ref 150–450)
RBC: 4.48 x10E6/uL (ref 3.77–5.28)
RDW: 11.8 % (ref 11.7–15.4)
WBC: 12.3 x10E3/uL — ABNORMAL HIGH (ref 3.4–10.8)

## 2024-08-24 LAB — VITAMIN D 25 HYDROXY (VIT D DEFICIENCY, FRACTURES): Vit D, 25-Hydroxy: 31.1 ng/mL (ref 30.0–100.0)

## 2024-08-24 LAB — FSH/LH
FSH: <0.3 m[IU]/mL
LH: <0.3 m[IU]/mL

## 2024-08-24 LAB — TESTOSTERONE, FREE, TOTAL, SHBG
Sex Hormone Binding: 115 nmol/L (ref 24.6–122.0)
Testosterone, Free: 5.2 pg/mL — ABNORMAL HIGH (ref 0.0–4.2)
Testosterone: 90 ng/dL — ABNORMAL HIGH (ref 13–71)

## 2024-08-24 LAB — ESTRADIOL: Estradiol: 9.1 pg/mL

## 2024-08-24 LAB — TSH: TSH: 3.53 u[IU]/mL (ref 0.450–4.500)

## 2024-08-24 LAB — PROLACTIN: Prolactin: 29.1 ng/mL (ref 4.8–33.4)

## 2024-08-24 LAB — DHEA-SULFATE: DHEA-SO4: 278 ug/dL (ref 84.8–378.0)

## 2024-08-27 ENCOUNTER — Ambulatory Visit
Admission: RE | Admit: 2024-08-27 | Discharge: 2024-08-27 | Disposition: A | Discharge status: Home / Self Care | Source: Ambulatory Visit | Attending: Family Medicine | Admitting: Family Medicine

## 2024-08-27 DIAGNOSIS — R7989 Other specified abnormal findings of blood chemistry: Secondary | ICD-10-CM | POA: Insufficient documentation

## 2024-08-29 NOTE — Telephone Encounter (Signed)
 Copied from CRM #[id number]. Topic: General - Other >> Aug 22, 2024  3:48 PM Fonda T wrote: Reason for CRM: Received call from patient, states she is inquiring on status of imaging test, ultrasound that was ordered.  Per patient inquiring where and when ultrasound will be scheduled, as per provider notes, states ultrasound will be scheduled.  Patient requesting a follow up call to answer above questions.   Can be reached at [phone].

## 2024-08-29 NOTE — Telephone Encounter (Signed)
 US  scheduled as of 08/27/2024.

## 2024-09-01 ENCOUNTER — Ambulatory Visit: Payer: Self-pay | Admitting: Family Medicine

## 2024-09-03 ENCOUNTER — Other Ambulatory Visit

## 2024-09-03 DIAGNOSIS — R7989 Other specified abnormal findings of blood chemistry: Secondary | ICD-10-CM

## 2024-09-20 LAB — 21-HYDROXYLASE ANTIBODIES: 21-Hydroxylase Antibodies: NEGATIVE

## 2024-09-20 LAB — FSH/LH
FSH: 5 m[IU]/mL
LH: 3.4 m[IU]/mL

## 2024-09-20 LAB — ESTRADIOL: Estradiol: 46.3 pg/mL

## 2024-09-20 LAB — 17-HYDROXYPROGESTERONE: 17-OH Progesterone LCMS: 15 ng/dL

## 2025-08-25 ENCOUNTER — Encounter: Admitting: Family Medicine
